# Patient Record
Sex: Male | Born: 1980 | Race: Asian | Hispanic: No | Marital: Married | Smoking: Never smoker
Health system: Southern US, Community
[De-identification: ages and names within clinical notes are randomized; demographics above are authoritative.]

## PROBLEM LIST (undated history)

## (undated) ENCOUNTER — Ambulatory Visit (HOSPITAL_COMMUNITY): Discharge status: Absent without leave | Payer: Self-pay

## (undated) DIAGNOSIS — E785 Hyperlipidemia, unspecified: Secondary | ICD-10-CM

## (undated) DIAGNOSIS — K219 Gastro-esophageal reflux disease without esophagitis: Secondary | ICD-10-CM

## (undated) HISTORY — DX: Gastro-esophageal reflux disease without esophagitis: K21.9

## (undated) HISTORY — DX: Hyperlipidemia, unspecified: E78.5

---

## 2008-11-21 ENCOUNTER — Emergency Department (HOSPITAL_COMMUNITY): Admission: EM | Admit: 2008-11-21 | Discharge: 2008-11-21 | Payer: Self-pay | Admitting: Family Medicine

## 2012-12-04 ENCOUNTER — Ambulatory Visit: Payer: BC Managed Care – PPO

## 2012-12-04 ENCOUNTER — Ambulatory Visit (INDEPENDENT_AMBULATORY_CARE_PROVIDER_SITE_OTHER): Payer: BC Managed Care – PPO | Admitting: Emergency Medicine

## 2012-12-04 VITALS — BP 122/75 | HR 81 | Temp 98.1°F | Resp 16 | Ht 71.0 in | Wt 200.0 lb

## 2012-12-04 DIAGNOSIS — R1013 Epigastric pain: Secondary | ICD-10-CM

## 2012-12-04 MED ORDER — ESOMEPRAZOLE MAGNESIUM 40 MG PO PACK: 40.0000 mg | PACK | Freq: Every day | ORAL | Status: DC

## 2012-12-04 NOTE — Progress Notes (Signed)
  Subjective:    Patient ID: Kenneth Sandoval, male    DOB: Feb 09, 1981, 32 y.o.   MRN: [id number]  HPI This 32 y.o. male presents for evaluation of abdominal pain x 2-3 weeks. 5/10. Comes and goes without trigger.  Last couple of days he's had difficulty burping. "Cramping" type pain in the upper abdomen (points to the epigastrum). Had a cold last week, with a fever and cough and runny nose, all now resolved.  Not exacerbated by eating. No NSAIDS, EtOH, increase in spicy/fatty/acidic foods, stress.  No tobacco use.  No stool changes. Possibly some increased bowel gas. No nausea/vomiting.  Past medical history, surgical history, family history, social history and problem list reviewed.  Review of Systems As above.    Objective:   Physical Exam Blood pressure 122/75, pulse 81, temperature 98.1 F (36.7 C), resp. rate 16, height 5\' 11"  (1.803 m), weight 200 lb (90.719 kg). Body mass index is 27.91 kg/(m^2). Well-developed, well nourished male who is awake, alert and oriented, in NAD. HEENT: [place]/AT, sclera and conjunctiva are clear.   Neck: supple, non-tender, no lymphadenopathy, thyromegaly. Heart: RRR, no murmur Lungs: normal effort, CTA Abdomen: normo-active bowel sounds, supple, no mass or organomegaly. Mild epigastric tenderness. Extremities: no cyanosis, clubbing or edema. Skin: warm and dry without rash. Psychologic: good mood and appropriate affect, normal speech and behavior.    Acute Abdominal Series: [facility] reading (PRIMARY) by  Dr. Dareen Piano.  No free air.  No evidence of ileus/obstruction/mass. Large stool in the colon.  Otherwise normal abdomen and chest.      Assessment & Plan:  Abdominal pain, epigastric - Plan: DG Abd Acute W/Chest, esomeprazole (NEXIUM) 40 MG packet, Comprehensive metabolic panel, H. pylori antibody, IgG  Anticipatory guidance.  Avoid reflux triggers.  Fernande Bras, PA-C Physician Assistant-Certified [facility]

## 2012-12-04 NOTE — Patient Instructions (Addendum)
Things that often make reflux symptoms worse: Caffeine Carbonation (soda) Spicy foods Acidic foods (like tomato sauce, orange juice, lemonade) Fatty foods (including whole milk and ice cream) Stress (feeling sad, worried, nervous) Nicotine Alcohol Anti-inflammatory medications (Advil, Aleve, ibuprofen, naproxen)  I will contact you with your lab results as soon as they are available.   If you have not heard from me in 2 weeks, please contact me.  The fastest way to get your results is to register for My Chart (see the instructions on the last page of this printout).

## 2012-12-05 LAB — COMPREHENSIVE METABOLIC PANEL
ALT: 89 U/L — ABNORMAL HIGH (ref 0–53)
AST: 32 U/L (ref 0–37)
Alkaline Phosphatase: 87 U/L (ref 39–117)
CO2: 27 mEq/L (ref 19–32)
Sodium: 142 mEq/L (ref 135–145)
Total Bilirubin: 0.5 mg/dL (ref 0.3–1.2)
Total Protein: 7.5 g/dL (ref 6.0–8.3)

## 2012-12-08 MED ORDER — AMOXICILLIN 500 MG PO CAPS: 1000.0000 mg | ORAL_CAPSULE | Freq: Two times a day (BID) | ORAL | Status: AC

## 2012-12-08 MED ORDER — CLARITHROMYCIN 500 MG PO TABS: 500.0000 mg | ORAL_TABLET | Freq: Two times a day (BID) | ORAL | Status: AC

## 2012-12-08 NOTE — Addendum Note (Signed)
Addended by: Fernande Bras on: 12/08/2012 03:10 PM   Modules accepted: Orders

## 2015-04-06 ENCOUNTER — Emergency Department (INDEPENDENT_AMBULATORY_CARE_PROVIDER_SITE_OTHER)
Admission: EM | Admit: 2015-04-06 | Discharge: 2015-04-06 | Disposition: A | Discharge status: Home / Self Care | Payer: Self-pay | Source: Home / Self Care

## 2015-04-06 ENCOUNTER — Encounter (HOSPITAL_COMMUNITY): Payer: Self-pay | Admitting: *Deleted

## 2015-04-06 DIAGNOSIS — R200 Anesthesia of skin: Secondary | ICD-10-CM

## 2015-04-06 DIAGNOSIS — R202 Paresthesia of skin: Secondary | ICD-10-CM

## 2015-04-06 DIAGNOSIS — D1720 Benign lipomatous neoplasm of skin and subcutaneous tissue of unspecified limb: Secondary | ICD-10-CM

## 2015-04-06 DIAGNOSIS — D1739 Benign lipomatous neoplasm of skin and subcutaneous tissue of other sites: Secondary | ICD-10-CM

## 2015-04-06 DIAGNOSIS — R5383 Other fatigue: Secondary | ICD-10-CM

## 2015-04-06 LAB — POCT I-STAT, CHEM 8
BUN: 12 mg/dL (ref 6–20)
CREATININE: 0.9 mg/dL (ref 0.61–1.24)
Calcium, Ion: 1.19 mmol/L (ref 1.12–1.23)
Chloride: 102 mmol/L (ref 101–111)
GLUCOSE: 100 mg/dL — AB (ref 65–99)
HEMATOCRIT: 45 % (ref 39.0–52.0)
HEMOGLOBIN: 15.3 g/dL (ref 13.0–17.0)
POTASSIUM: 3.7 mmol/L (ref 3.5–5.1)
Sodium: 140 mmol/L (ref 135–145)
TCO2: 24 mmol/L (ref 0–100)

## 2015-04-06 NOTE — ED Notes (Addendum)
For  The  Last  3  -4  Weeks   Random pains  Weakness   Numbness   Hands  And  Feet    Fingers  Seem   sensative       No  Headaches            Anxiety         Under  Stress           stress  At  School      Also  Hands  Lump  And  Pain  r  Thigh

## 2017-05-10 ENCOUNTER — Other Ambulatory Visit: Payer: Self-pay | Admitting: Family Medicine

## 2017-05-10 ENCOUNTER — Ambulatory Visit
Admission: RE | Admit: 2017-05-10 | Discharge: 2017-05-10 | Disposition: A | Discharge status: Home / Self Care | Payer: Managed Care, Other (non HMO) | Source: Ambulatory Visit | Attending: Family Medicine | Admitting: Family Medicine

## 2017-05-10 DIAGNOSIS — R519 Headache, unspecified: Secondary | ICD-10-CM

## 2017-05-10 DIAGNOSIS — R51 Headache: Principal | ICD-10-CM

## 2017-05-10 DIAGNOSIS — G8929 Other chronic pain: Secondary | ICD-10-CM

## 2017-07-09 ENCOUNTER — Other Ambulatory Visit: Payer: Self-pay | Admitting: *Deleted

## 2017-07-09 ENCOUNTER — Encounter: Payer: Self-pay | Admitting: *Deleted

## 2017-07-09 ENCOUNTER — Ambulatory Visit: Payer: Managed Care, Other (non HMO) | Admitting: Diagnostic Neuroimaging

## 2017-07-09 ENCOUNTER — Encounter (INDEPENDENT_AMBULATORY_CARE_PROVIDER_SITE_OTHER): Payer: Self-pay

## 2017-07-09 VITALS — BP 148/88 | HR 105 | Ht 69.5 in | Wt 193.0 lb

## 2017-07-09 DIAGNOSIS — G43809 Other migraine, not intractable, without status migrainosus: Secondary | ICD-10-CM | POA: Diagnosis not present

## 2017-07-09 DIAGNOSIS — R519 Headache, unspecified: Secondary | ICD-10-CM

## 2017-07-09 DIAGNOSIS — R51 Headache: Secondary | ICD-10-CM | POA: Diagnosis not present

## 2017-07-09 DIAGNOSIS — G4709 Other insomnia: Secondary | ICD-10-CM | POA: Diagnosis not present

## 2017-07-09 DIAGNOSIS — G4489 Other headache syndrome: Secondary | ICD-10-CM | POA: Diagnosis not present

## 2017-07-09 MED ORDER — PREDNISONE 10 MG PO TABS: ORAL_TABLET | ORAL | 0 refills | Status: DC

## 2017-07-09 NOTE — Patient Instructions (Addendum)
-   check MRI brain  - check sleep study  - trial of prednisone dose pack; take 60mg  on day 1. Reduce by 10mg  each subsequent day. (60, 50, 40, 30, 20, 10, stop)

## 2017-07-09 NOTE — Progress Notes (Signed)
[facility]  PATIENT: Kenneth Sandoval DOB: [birth year]  REFERRING CLINICIAN: D Swayne HISTORY FROM: patient  REASON FOR VISIT: new consult    HISTORICAL  CHIEF COMPLAINT:  Chief Complaint  Patient presents with  . NP Dr. Antony Contras  . Numbness/ Headaches    having intermittent numbness in fingers and toes, but the main reason here if for headaches.     HISTORY OF PRESENT ILLNESS:   37 year old male with hyperglycemia, here for evaluation of headaches and numbness and tingling.  October 2018 patient had onset of intermittent right-sided headaches with heavy sensation.  Sometimes he would feel frontal or bilateral occipital headaches.  Headaches can last minutes or hours at a time.  Headaches occurring on daily basis.  No nausea or vomiting.  No photophobia.  He has some sensitivity to sound.  No dizziness or blurred vision with headaches.  No prior similar headaches.  No significant triggering factors.  No change in caffeine, nutrition, exercise, sleep or stress.  He was under increased stress 9 months ago and 2 years ago but not currently.  Patient is physically active, exercises 2-3 times per week including jogging and playing soccer.  He does not feel limited with physical activity by these headaches.  Headaches do not worsen with exertion or exercise.  Lately patient wakes up with sinus congestion on the right side of his nose and face.  Sometimes when he clears his nose he sees some blood.  He is planning to have ENT appointment soon.  Patient does note some generalized blurred vision lately, and spends a lot of time on the computer.  He is planning to have an eye exam soon.  No family history of headaches.  No recent traumas, injuries or infections.  Patient has tried Tylenol and Excedrin Migraine without relief.  Also 3 weeks ago patient had new onset of numbness and tingling in his fingers and toes, lasting for 1-2 weeks.  Now symptoms have  resolved.    REVIEW OF SYSTEMS: Full 14 system review of systems performed and negative with exception of: Headache numbness.  ALLERGIES: No Known Allergies  HOME MEDICATIONS: Outpatient Medications Prior to Visit  Medication Sig Dispense Refill  . esomeprazole (NEXIUM) 40 MG packet Take 40 mg by mouth daily before breakfast. 30 each 3  . aspirin 325 MG tablet Take 325 mg by mouth daily.     No facility-administered medications prior to visit.     PAST MEDICAL HISTORY: Past Medical History:  Diagnosis Date  . GERD (gastroesophageal reflux disease)     PAST SURGICAL HISTORY: No past surgical history on file.  FAMILY HISTORY: Family History  Problem Relation Age of Onset  . Asthma Father     SOCIAL HISTORY:  Social History   Socioeconomic History  . Marital status: Married    Spouse name: Not on file  . Number of children: 2  . Years of education: Not on file  . Highest education level: Not on file  Social Needs  . Financial resource strain: Not on file  . Food insecurity - worry: Not on file  . Food insecurity - inability: Not on file  . Transportation needs - medical: Not on file  . Transportation needs - non-medical: Not on file  Occupational History  . 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 Smoking status: Never Smoker  . Smokeless tobacco: Never Used  Substance and Sexual Activity  . Alcohol  use: No  . Drug use: No  . Sexual activity: Not on file  Other Topics Concern  . Not on file  Social History Narrative   Originally from [place], came to the [place] in 2009.  Lives at home with wife and 2 kids.  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       PHYSICAL EXAM  GENERAL EXAM/CONSTITUTIONAL: Vitals:  Vitals:   07/09/17 1322  BP: (!) 148/88  Pulse: (!) 105  Weight: 193 lb (87.5 kg)  Height: 5' 9.5" (1.765 m)     Body mass index is 28.09  kg/m.  No exam data present  Patient is in no distress; well developed, nourished and groomed; neck is supple  CARDIOVASCULAR:  Examination of carotid arteries is normal; no carotid bruits  Regular rate and rhythm, no murmurs  Examination of peripheral vascular system by observation and palpation is normal  EYES:  Ophthalmoscopic exam of optic discs and posterior segments is normal; no papilledema or hemorrhages  MUSCULOSKELETAL:  Gait, strength, tone, movements noted in Neurologic exam below  NEUROLOGIC: MENTAL STATUS:  No flowsheet data found.  awake, alert, oriented to person, place and time  recent and remote memory intact  normal attention and concentration  language fluent, comprehension intact, naming intact,   fund of knowledge appropriate  CRANIAL NERVE:   2nd - no papilledema on fundoscopic exam  2nd, 3rd, 4th, 6th - pupils equal and reactive to light, visual fields full to confrontation, extraocular muscles intact, no nystagmus  5th - facial sensation symmetric  7th - facial strength symmetric  8th - hearing intact  9th - palate elevates symmetrically, uvula midline  11th - shoulder shrug symmetric  12th - tongue protrusion midline  MOTOR:   normal bulk and tone, full strength in the BUE, BLE  SENSORY:   normal and symmetric to light touch, temperature, vibration  COORDINATION:   finger-nose-finger, fine finger movements normal  REFLEXES:   deep tendon reflexes present and symmetric  GAIT/STATION:   narrow based gait    DIAGNOSTIC DATA (LABS, IMAGING, TESTING) - I reviewed patient records, labs, notes, testing and imaging myself where available.  Lab Results  Component Value Date   HGB 15.3 04/06/2015   HCT 45.0 04/06/2015      Component Value Date/Time   NA 140 04/06/2015 1904   K 3.7 04/06/2015 1904   CL 102 04/06/2015 1904   CO2 27 12/04/2012 2032   GLUCOSE 100 (H) 04/06/2015 1904   BUN 12 04/06/2015 1904    CREATININE 0.90 04/06/2015 1904   CREATININE 1.07 12/04/2012 2032   CALCIUM 10.0 12/04/2012 2032   PROT 7.5 12/04/2012 2032   ALBUMIN 4.7 12/04/2012 2032   AST 32 12/04/2012 2032   ALT 89 (H) 12/04/2012 2032   ALKPHOS 87 12/04/2012 2032   BILITOT 0.5 12/04/2012 2032   No results found for: CHOL, HDL, LDLCALC, LDLDIRECT, TRIG, CHOLHDL No results found for: HGBA1C No results found for: VITAMINB12 No results found for: TSH   05/10/17 CT head [I reviewed images myself and agree with interpretation. -VRP]  - negative      ASSESSMENT AND PLAN  37 y.o. year old male here with new onset right-sided headaches, global headaches, with phonophobia, numbness and tingling, since October 2018.  Could represent migraine variant.  Will rule out other causes with MRI of the brain and sleep study.   Dx:   1. Chronic daily headache   2. Migraine variant  3. Other insomnia      PLAN:  - check MRI brain w wo - check sleep study (interrupted sleep; waking up with HA; sinus congestion in AM; elevated BP; rule out OSA) - trial of prednisone dose pack  Orders Placed This Encounter  Procedures  . MR BRAIN W WO CONTRAST  . Ambulatory referral to Sleep Studies   Meds ordered this encounter  Medications  . predniSONE (DELTASONE) 10 MG tablet    Sig: Take 60mg  on day 1. Reduce by 10mg  each subsequent day. (60, 50, 40, 30, 20, 10, stop)    Dispense:  21 tablet    Refill:  0   Return in about 2 months (around 09/06/2017).    Penni Bombard, MD 0/60/1561, 5:37 PM Certified in Neurology, Neurophysiology and Neuroimaging  [facility] [address], [place], [place] [address] [phone]

## 2017-07-15 ENCOUNTER — Ambulatory Visit: Payer: Managed Care, Other (non HMO) | Admitting: Neurology

## 2017-07-15 ENCOUNTER — Encounter: Payer: Self-pay | Admitting: Neurology

## 2017-07-15 VITALS — BP 124/83 | HR 75 | Ht 72.0 in | Wt 195.0 lb

## 2017-07-15 DIAGNOSIS — R519 Headache, unspecified: Secondary | ICD-10-CM

## 2017-07-15 DIAGNOSIS — R51 Headache: Principal | ICD-10-CM

## 2017-07-15 DIAGNOSIS — E663 Overweight: Secondary | ICD-10-CM | POA: Diagnosis not present

## 2017-07-15 DIAGNOSIS — G478 Other sleep disorders: Secondary | ICD-10-CM

## 2017-07-15 DIAGNOSIS — R351 Nocturia: Secondary | ICD-10-CM

## 2017-07-15 NOTE — Patient Instructions (Addendum)
Thank you for choosing [facility] for your sleep related care! It was nice to meet you today! I appreciate that you entrust me with your sleep related healthcare concerns. I hope, I was able to address at least some of your concerns today, and that I can help you feel reassured and also get better.    Here is what we discussed today and what we came up with as our plan for you:    Based on your symptoms and your exam I believe you may be at small risk for obstructive sleep apnea or OSA, and I think we should proceed with a sleep study to determine whether you do or do not have OSA and how severe it is. If you have more than mild OSA, I want you to consider treatment with CPAP. Please remember, the risks and ramifications of moderate to severe obstructive sleep apnea or OSA are: Cardiovascular disease, including congestive heart failure, stroke, difficult to control hypertension, arrhythmias, and even type 2 diabetes has been linked to untreated OSA. Sleep apnea causes disruption of sleep and sleep deprivation in most cases, which, in turn, can cause recurrent headaches, problems with memory, mood, concentration, focus, and vigilance. Most people with untreated sleep apnea report excessive daytime sleepiness, which can affect their ability to drive. Please do not drive if you feel sleepy.   I will likely see you back after your sleep study to go over the test results and where to go from there. We will call you after your sleep study to advise about the results (most likely, you will hear from [place], my nurse) and to set up an appointment at the time, as necessary.    Our sleep lab administrative assistant, Arrie Aran will call you to schedule your sleep study. If you don't hear back from her by about 2 weeks from now, please feel free to call her at [phone]. You can leave a message with your phone number and concerns, if you get the voicemail box. She will call back as soon as possible.

## 2017-07-15 NOTE — Progress Notes (Signed)
Subjective:    Patient ID: Kenneth Sandoval is a 37 y.o. male.  HPI     Star Age, MD, PhD [facility] [address] [place], [place] [address] Dear Kenneth Sandoval,   I saw your patient, Kenneth Sandoval, upon your kind request in my clinic today for initial consultation of his sleep disorder, in particular, concern for underlying obstructive sleep apnea. The patient is unaccompanied today. As you know, Kenneth Sandoval is a 37 year old right-handed gentleman with an underlying medical history of reflux disease, recurrent headaches and overweight state, who reports waking up with headaches at times and sleep disruption. I reviewed your office note from 07/09/2017. His Epworth sleepiness score is 2 out of 24 today, fatigue score is 17 out of 63. He has some low back pain for years. No recent injury. He has multiple nighttime awakenings for no apparent reason. He is not sure if he snores. Wife has not complained about his sleep. He does not endorse any telltale symptoms of restless leg syndrome. He has nocturia about once per average night. He lives at home with his wife and 2 children. There are no pets in the bedroom and can sleep in her own bedroom. He is a nonsmoker and does not utilize alcohol and drinks caffeine in the form of coffee, typically 2 cups per day ENT, typically 4 cups per day on average. He works as an Producer, television/film/video. He does not have a family history of OSA. His bedtime is around midnight and wake up time around 6:20. He does not have any nocturnal headaches but has woken up noticing a headache.  His Past Medical History Is Significant For: Past Medical History:  Diagnosis Date  . GERD (gastroesophageal reflux disease)     His Past Surgical History Is Significant For: No past surgical history on file.  His Family History Is Significant For: Family History  Problem Relation Age of Onset  . Asthma Father     His Social  History Is Significant For: Social History   Socioeconomic History  . Marital status: Married    Spouse name: None  . Number of children: 2  . Years of education: None  . Highest education level: None  Social Needs  . Financial resource strain: None  . Food insecurity - worry: None  . Food insecurity - inability: None  . Transportation needs - medical: None  . Transportation needs - non-medical: None  Occupational History  . 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 Smoking status: Never Smoker  . Smokeless tobacco: Never Used  Substance and Sexual Activity  . Alcohol use: No  . Drug use: No  . Sexual activity: None  Other Topics Concern  . None  Social History Narrative   Originally from [place], came to the [place] in 2009.  Lives at home with wife and 2 kids.  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      His Allergies Are:  No Known Allergies:   His Current Medications Are:  Outpatient Encounter Medications as of 07/15/2017  Medication Sig  . predniSONE (DELTASONE) 10 MG tablet Take 60mg  on day 1. Reduce by 10mg  each subsequent day. (60, 50, 40, 30, 20, 10, stop)  . [DISCONTINUED] esomeprazole (NEXIUM) 40 MG packet Take 40 mg by mouth daily before breakfast.   No facility-administered encounter medications on file as of 07/15/2017.   :  Review of Systems:  Out of a complete 14 point review of systems, all are reviewed and negative with the exception of these symptoms as listed below: Review of Systems  Neurological:       Pt presents today to discuss his sleep. Pt has never had a sleep study and does not endorse snoring.  Epworth Sleepiness Scale 0= would never doze 1= slight chance of dozing 2= moderate chance of dozing 3= high chance of dozing  Sitting and reading: 0 Watching TV: 1 Sitting inactive in a public place (ex. Theater or meeting): 0 As a passenger in a  car for an hour without a break: 0 Lying down to rest in the afternoon: 1 Sitting and talking to someone: 0 Sitting quietly after lunch (no alcohol): 0 In a car, while stopped in traffic: 0 Total: 2     Objective:  Neurological Exam  Physical Exam Physical Examination:   Vitals:   07/15/17 0910  BP: 124/83  Pulse: 75    General Examination: The patient is a very pleasant 37 y.o. male in no acute distress. He appears well-developed and well-nourished and well groomed.   HEENT: Normocephalic, atraumatic, pupils are equal, round and reactive to light and accommodation. Extraocular tracking is good without limitation to gaze excursion or nystagmus noted. Normal smooth pursuit is noted. Hearing is grossly intact. Face is symmetric with normal facial animation and normal facial sensation. Speech is clear with no dysarthria noted. There is no hypophonia. There is no lip, neck/head, jaw or voice tremor. Neck is supple with full range of passive and active motion. There are no carotid bruits on auscultation. Oropharynx exam reveals: mild mouth dryness, good dental hygiene and mild airway crowding, due to larger appearing uvula, wider tongue. Mallampati is class II, tonsils are 1+ in size, neck circumference 15-7/8 inches. Tongue protrudes centrally and palate elevates symmetrically.  Chest: Clear to auscultation without wheezing, rhonchi or crackles noted.  Heart: S1+S2+0, regular and normal without murmurs, rubs or gallops noted.   Abdomen: Soft, non-tender and non-distended with normal bowel sounds appreciated on auscultation.  Extremities: There is no pitting edema in the distal lower extremities bilaterally. Pedal pulses are intact.  Skin: Warm and dry without trophic changes noted.  Musculoskeletal: exam reveals no obvious joint deformities, tenderness or joint swelling or erythema.   Neurologically:  Mental status: The patient is awake, alert and oriented in all 4 spheres. His  immediate and remote memory, attention, language skills and fund of knowledge are appropriate. There is no evidence of aphasia, agnosia, apraxia or anomia. Speech is clear with normal prosody and enunciation. Thought process is linear. Mood is normal and affect is normal.  Cranial nerves II - XII are as described above under HEENT exam. In addition: shoulder shrug is normal with equal shoulder height noted. Motor exam: Normal bulk, strength and tone is noted. There is no drift, tremor or rebound. Romberg is negative. Reflexes are 1+ throughout. Fine motor skills and coordination: intact with normal finger taps, normal hand movements, normal rapid alternating patting, normal foot taps and normal foot agility.  Cerebellar testing: No dysmetria or intention tremor. There is no truncal or gait ataxia.  Sensory exam: intact to light touch.  Gait, station and balance: He stands easily. No veering to one side is noted. No leaning to one side is noted. Posture is age-appropriate and stance is narrow based. Gait shows normal stride length and normal pace. No problems turning are noted.  Assessment and Plan:   In summary, Kenneth Sandoval is a very pleasant 37 y.o.-year old male with an underlying medical history of reflux disease, recurrent headaches and overweight state, who presents for sleep consultation. He does not have a telltale history concerning for obstructive sleep apnea, nevertheless, he endorses sleep disruption and nonrestorative sleep, nocturia, morning headaches. We talked about the importance of good sleep hygiene and allowing enough sleep time of 7-8 hours. He is not getting more than 6 hours of sleep on a good night. We talked about limiting caffeine intake as well. We will proceed with sleep study testing and take it from there.  I answered all his questions today and the patient was in agreement.  Thank you very much for allowing me to participate in the care of this nice  patient. If I can be of any further assistance to you please do not hesitate to talk to me. Sincerely,   Star Age, MD, PhD

## 2017-07-27 ENCOUNTER — Inpatient Hospital Stay: Admission: RE | Admit: 2017-07-27 | Payer: Managed Care, Other (non HMO) | Source: Ambulatory Visit

## 2017-07-29 ENCOUNTER — Telehealth: Payer: Self-pay | Admitting: Neurology

## 2017-07-29 NOTE — Telephone Encounter (Signed)
We have attempted to call the patient two times to schedule sleep study. Patient has been unavailable at the phone numbers we have on file, and has not returned our calls. At this time, we will send a letter asking patient to please contact the sleep lab.  °

## 2017-08-02 ENCOUNTER — Other Ambulatory Visit: Payer: Managed Care, Other (non HMO)

## 2017-09-27 ENCOUNTER — Ambulatory Visit
Admission: RE | Admit: 2017-09-27 | Discharge: 2017-09-27 | Disposition: A | Discharge status: Home / Self Care | Payer: Managed Care, Other (non HMO) | Source: Ambulatory Visit | Attending: Diagnostic Neuroimaging | Admitting: Diagnostic Neuroimaging

## 2017-09-27 DIAGNOSIS — G4489 Other headache syndrome: Secondary | ICD-10-CM

## 2017-09-27 MED ORDER — GADOBENATE DIMEGLUMINE 529 MG/ML IV SOLN
19.0000 mL | Freq: Once | INTRAVENOUS | Status: AC | PRN
  Administered 2017-09-27: 19 mL via INTRAVENOUS

## 2017-10-01 ENCOUNTER — Telehealth: Payer: Self-pay | Admitting: *Deleted

## 2017-10-01 NOTE — Telephone Encounter (Signed)
Spoke to pt and relayed that MRI normal study, continue current plan.  He verbalized understanding and has appt tomorrow 10-02-17.

## 2017-10-01 NOTE — Telephone Encounter (Signed)
-----   Message from Penni Bombard, MD sent at 09/30/2017 12:59 PM EDT ----- Normal imaging results. Please call patient. Continue current plan. -VRP

## 2017-10-02 ENCOUNTER — Encounter: Payer: Self-pay | Admitting: Diagnostic Neuroimaging

## 2017-10-02 ENCOUNTER — Ambulatory Visit: Payer: Managed Care, Other (non HMO) | Admitting: Diagnostic Neuroimaging

## 2017-10-02 VITALS — BP 122/72 | HR 74 | Ht 72.0 in | Wt 197.0 lb

## 2017-10-02 DIAGNOSIS — G43809 Other migraine, not intractable, without status migrainosus: Secondary | ICD-10-CM | POA: Diagnosis not present

## 2017-10-02 DIAGNOSIS — G4709 Other insomnia: Secondary | ICD-10-CM | POA: Diagnosis not present

## 2017-10-02 MED ORDER — AMITRIPTYLINE HCL 25 MG PO TABS: 25.0000 mg | ORAL_TABLET | Freq: Every day | ORAL | 6 refills | Status: DC

## 2017-10-02 NOTE — Progress Notes (Signed)
[facility]  PATIENT: Kenneth Sandoval DOB: [birth year]  REFERRING CLINICIAN: D Swayne HISTORY FROM: patient  REASON FOR VISIT: follow up   HISTORICAL  CHIEF COMPLAINT:  Chief Complaint  Patient presents with  . Headache    rm 6, "no improvement in chronic headaches; never did sleep study, don't really think I have a sleep problem"  . Follow-up    2 month    HISTORY OF PRESENT ILLNESS:   UPDATE (10/02/17, VRP): Since last visit, doing about the same. Numbness is resolved. Still with stuffy head feeling, intermittent headaches, (5/10 severity). MRI brain was normal. Did not get sleep study.    PRIOR HPI (07/09/17): 37 year old male with hyperglycemia, here for evaluation of headaches and numbness and tingling.  October 2018 patient had onset of intermittent right-sided headaches with heavy sensation.  Sometimes he would feel frontal or bilateral occipital headaches.  Headaches can last minutes or hours at a time.  Headaches occurring on daily basis.  No nausea or vomiting.  No photophobia.  He has some sensitivity to sound.  No dizziness or blurred vision with headaches.  No prior similar headaches.  No significant triggering factors.  No change in caffeine, nutrition, exercise, sleep or stress.  He was under increased stress 9 months ago and 2 years ago but not currently.  Patient is physically active, exercises 2-3 times per week including jogging and playing soccer.  He does not feel limited with physical activity by these headaches.  Headaches do not worsen with exertion or exercise.  Lately patient wakes up with sinus congestion on the right side of his nose and face.  Sometimes when he clears his nose he sees some blood.  He is planning to have ENT appointment soon.  Patient does note some generalized blurred vision lately, and spends a lot of time on the computer.  He is planning to have an eye exam soon.  No family history of headaches.  No recent  traumas, injuries or infections.  Patient has tried Tylenol and Excedrin Migraine without relief.  Also 3 weeks ago patient had new onset of numbness and tingling in his fingers and toes, lasting for 1-2 weeks.  Now symptoms have resolved.    REVIEW OF SYSTEMS: Full 14 system review of systems performed and negative with exception of: Headache numbness.  ALLERGIES: No Known Allergies  HOME MEDICATIONS: Outpatient Medications Prior to Visit  Medication Sig Dispense Refill  . predniSONE (DELTASONE) 10 MG tablet Take 60mg  on day 1. Reduce by 10mg  each subsequent day. (60, 50, 40, 30, 20, 10, stop) 21 tablet 0   No facility-administered medications prior to visit.     PAST MEDICAL HISTORY: Past Medical History:  Diagnosis Date  . GERD (gastroesophageal reflux disease)     PAST SURGICAL HISTORY: No past surgical history on file.  FAMILY HISTORY: Family History  Problem Relation Age of Onset  . Asthma Father     SOCIAL HISTORY:  Social History   Socioeconomic History  . Marital status: Married    Spouse name: Not on file  . Number of children: 2  . Years of education: Not on file  . Highest education level: Not on file  Occupational History  . 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 Financial resource strain: Not on file  . Food insecurity:    Worry: Not on file    Inability: Not on file  . Transportation needs:  Medical: Not on file    Non-medical: Not on file  Tobacco Use  . Smoking status: Never Smoker  . Smokeless tobacco: Never Used  Substance and Sexual Activity  . Alcohol use: No  . Drug use: No  . Sexual activity: Not on file  Lifestyle  . Physical activity:    Days per week: Not on file    Minutes per session: Not on file  . Stress: Not on file  Relationships  . Social connections:    Talks on phone: Not on file    Gets together: Not on file    Attends religious service: Not on  file    Active member of club or organization: Not on file    Attends meetings of clubs or organizations: Not on file    Relationship status: Not on file  . Intimate partner violence:    Fear of current or ex partner: Not on file    Emotionally abused: Not on file    Physically abused: Not on file    Forced sexual activity: Not on file  Other Topics Concern  . Not on file  Social History Narrative   Originally from [place], came to the [place] in 2009.  Lives at home with wife and 2 kids.  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       PHYSICAL EXAM  GENERAL EXAM/CONSTITUTIONAL: Vitals:  Vitals:   10/02/17 1323  BP: 122/72  Pulse: 74  Weight: 197 lb (89.4 kg)  Height: 6' (1.829 m)   Body mass index is 26.72 kg/m. No exam data present  Patient is in no distress; well developed, nourished and groomed; neck is supple  CARDIOVASCULAR:  Examination of carotid arteries is normal; no carotid bruits  Regular rate and rhythm, no murmurs  Examination of peripheral vascular system by observation and palpation is normal  EYES:  Ophthalmoscopic exam of optic discs and posterior segments is normal; no papilledema or hemorrhages  MUSCULOSKELETAL:  Gait, strength, tone, movements noted in Neurologic exam below  NEUROLOGIC: MENTAL STATUS:  No flowsheet data found.  awake, alert, oriented to person, place and time  recent and remote memory intact  normal attention and concentration  language fluent, comprehension intact, naming intact,   fund of knowledge appropriate  CRANIAL NERVE:   2nd - no papilledema on fundoscopic exam  2nd, 3rd, 4th, 6th - pupils equal and reactive to light, visual fields full to confrontation, extraocular muscles intact, no nystagmus  5th - facial sensation symmetric  7th - facial strength symmetric  8th - hearing intact  9th - palate elevates symmetrically, uvula midline  11th - shoulder shrug  symmetric  12th - tongue protrusion midline  MOTOR:   normal bulk and tone, full strength in the BUE, BLE  SENSORY:   normal and symmetric to light touch, temperature, vibration  COORDINATION:   finger-nose-finger, fine finger movements normal  REFLEXES:   deep tendon reflexes present and symmetric  GAIT/STATION:   narrow based gait    DIAGNOSTIC DATA (LABS, IMAGING, TESTING) - I reviewed patient records, labs, notes, testing and imaging myself where available.  Lab Results  Component Value Date   HGB 15.3 04/06/2015   HCT 45.0 04/06/2015      Component Value Date/Time   NA 140 04/06/2015 1904   K 3.7 04/06/2015 1904   CL 102 04/06/2015 1904   CO2 27 12/04/2012 2032   GLUCOSE 100 (H) 04/06/2015 1904   BUN 12  04/06/2015 1904   CREATININE 0.90 04/06/2015 1904   CREATININE 1.07 12/04/2012 2032   CALCIUM 10.0 12/04/2012 2032   PROT 7.5 12/04/2012 2032   ALBUMIN 4.7 12/04/2012 2032   AST 32 12/04/2012 2032   ALT 89 (H) 12/04/2012 2032   ALKPHOS 87 12/04/2012 2032   BILITOT 0.5 12/04/2012 2032   No results found for: CHOL, HDL, LDLCALC, LDLDIRECT, TRIG, CHOLHDL No results found for: HGBA1C No results found for: VITAMINB12 No results found for: TSH   05/10/17 CT head [I reviewed images myself and agree with interpretation. -VRP]  - negative   09/27/17 MRI brain  - This is a normal MRI of the brain with and without contrast.     ASSESSMENT AND PLAN  37 y.o. year old male here with new onset right-sided headaches, global headaches, with phonophobia, numbness and tingling, since October 2018.  Could represent migraine variant.     Dx:   1. Migraine variant   2. Other insomnia      PLAN:  MIGRAINE VARIANT - trial of amitriptyline 25mg  at bedtime - brain healthy habits reviewed - monitor headaches; consider ibuprofen, tylenol, amitriptyline, topiramate in future - consider follow up with PCP, allergy and ENT for sinus congestion issues  Return  in about 6 months (around 04/03/2018).    Penni Bombard, MD 2/83/1517, 6:16 PM Certified in Neurology, Neurophysiology and Neuroimaging  [facility] [address] [place], [place] [address] [phone]

## 2017-10-02 NOTE — Patient Instructions (Signed)
-   trial of amitriptyline 25mg  at bedtime  - monitor headaches  - consider ibuprofen, tylenol as needed   - start amitriptyline 25mg  at bedtime  - consider follow up with PCP, allergy and ENT for sinus congestion issues

## 2018-04-14 ENCOUNTER — Encounter: Payer: Self-pay | Admitting: Diagnostic Neuroimaging

## 2018-04-14 ENCOUNTER — Ambulatory Visit: Payer: Managed Care, Other (non HMO) | Admitting: Diagnostic Neuroimaging

## 2018-04-14 VITALS — BP 126/78 | HR 74 | Ht 72.0 in | Wt 191.6 lb

## 2018-04-14 DIAGNOSIS — G43809 Other migraine, not intractable, without status migrainosus: Secondary | ICD-10-CM

## 2018-04-14 NOTE — Progress Notes (Signed)
[facility]  PATIENT: Kenneth Sandoval DOB: 1980-12-15  REFERRING CLINICIAN: D Swayne HISTORY FROM: patient  REASON FOR VISIT: follow up   HISTORICAL  CHIEF COMPLAINT:  Chief Complaint  Patient presents with  . Migraine    rm 7, "not taking any medications now, better in last 3 weeks, I don't think it's migraines; I've had an earache on right side- ENT said I might have TMJ"  . Follow-up    6 month    HISTORY OF PRESENT ILLNESS:   UPDATE (04/14/18, VRP): Since last visit, doing about the same. Symptoms are stable. Severity is mild. No alleviating or aggravating factors. Daily sxs. Foggy feeling in the right side. Some right sided pain. Some right ear pain. Numbness has resolved. Has [facility] (tried zonisamide, trigger point injections). 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   UPDATE (10/02/17, VRP): Since last visit, doing about the same. Numbness is resolved. Still with stuffy head feeling, intermittent headaches, (5/10 severity). MRI brain was normal. Did not get sleep study.    PRIOR HPI (07/09/17): 37 year old male with hyperglycemia, here for evaluation of headaches and numbness and tingling.  October 2018 patient had onset of intermittent right-sided headaches with heavy sensation.  Sometimes he would feel frontal or bilateral occipital headaches.  Headaches can last minutes or hours at a time.  Headaches occurring on daily basis.  No nausea or vomiting.  No photophobia.  He has some sensitivity to sound.  No dizziness or blurred vision with headaches.  No prior similar headaches.  No significant triggering factors.  No change in caffeine, nutrition, exercise, sleep or stress.  He was under increased stress 9 months ago and 2 years ago but not currently.  Patient is physically active, exercises 2-3 times per week including jogging and playing soccer.  He does not feel limited with physical activity by these headaches.   Headaches do not worsen with exertion or exercise.  Lately patient wakes up with sinus congestion on the right side of his nose and face.  Sometimes when he clears his nose he sees some blood.  He is planning to have ENT appointment soon.  Patient does note some generalized blurred vision lately, and spends a lot of time on the computer.  He is planning to have an eye exam soon.  No family history of headaches.  No recent traumas, injuries or infections.  Patient has tried Tylenol and Excedrin Migraine without relief.  Also 3 weeks ago patient had new onset of numbness and tingling in his fingers and toes, lasting for 1-2 weeks.  Now symptoms have resolved.    REVIEW OF SYSTEMS: Full 14 system review of systems performed and negative with exception of: Headache numbness.  ALLERGIES: No Known Allergies  HOME MEDICATIONS: Outpatient Medications Prior to Visit  Medication Sig Dispense Refill  . amitriptyline (ELAVIL) 25 MG tablet Take 1 tablet (25 mg total) by mouth at bedtime. (Patient not taking: Reported on 04/14/2018) 30 tablet 6   No facility-administered medications prior to visit.     PAST MEDICAL HISTORY: Past Medical History:  Diagnosis Date  . GERD (gastroesophageal reflux disease)     PAST SURGICAL HISTORY: No past surgical history on file.  FAMILY HISTORY: Family History  Problem Relation Age of Onset  . Asthma Father     SOCIAL HISTORY:  Social History   Socioeconomic History  . Marital status: Married    Spouse name: Not on file  . Number of  children: 2  . Years of education: Not on file  . Highest education level: Not on file  Occupational History  . 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 Financial resource strain: Not on file  . Food insecurity:    Worry: Not on file    Inability: Not on file  . Transportation needs:    Medical: Not on file    Non-medical: Not on file  Tobacco Use    . Smoking status: Never Smoker  . Smokeless tobacco: Never Used  Substance and Sexual Activity  . Alcohol use: No  . Drug use: No  . Sexual activity: Not on file  Lifestyle  . Physical activity:    Days per week: Not on file    Minutes per session: Not on file  . Stress: Not on file  Relationships  . Social connections:    Talks on phone: Not on file    Gets together: Not on file    Attends religious service: Not on file    Active member of club or organization: Not on file    Attends meetings of clubs or organizations: Not on file    Relationship status: Not on file  . Intimate partner violence:    Fear of current or ex partner: Not on file    Emotionally abused: Not on file    Physically abused: Not on file    Forced sexual activity: Not on file  Other Topics Concern  . Not on file  Social History Narrative   Originally from [place], came to the [place] in 2009.  Lives at home with wife and 2 kids.  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       PHYSICAL EXAM  GENERAL EXAM/CONSTITUTIONAL: Vitals:  Vitals:   04/14/18 1322  BP: 126/78  Pulse: 74  Weight: 191 lb 9.6 oz (86.9 kg)  Height: 6' (1.829 m)   Body mass index is 25.99 kg/m. No exam data present  Patient is in no distress; well developed, nourished and groomed; neck is supple  CARDIOVASCULAR:  Examination of carotid arteries is normal; no carotid bruits  Regular rate and rhythm, no murmurs  Examination of peripheral vascular system by observation and palpation is normal  EYES:  Ophthalmoscopic exam of optic discs and posterior segments is normal; no papilledema or hemorrhages  MUSCULOSKELETAL:  Gait, strength, tone, movements noted in Neurologic exam below  NEUROLOGIC: MENTAL STATUS:  No flowsheet data found.  awake, alert, oriented to person, place and time  recent and remote memory intact  normal attention and concentration  language fluent,  comprehension intact, naming intact,   fund of knowledge appropriate  CRANIAL NERVE:   2nd - no papilledema on fundoscopic exam  2nd, 3rd, 4th, 6th - pupils equal and reactive to light, visual fields full to confrontation, extraocular muscles intact, no nystagmus  5th - facial sensation symmetric  7th - facial strength symmetric  8th - hearing intact  9th - palate elevates symmetrically, uvula midline  11th - shoulder shrug symmetric  12th - tongue protrusion midline  MOTOR:   normal bulk and tone, full strength in the BUE, BLE  SENSORY:   normal and symmetric to light touch, temperature, vibration  COORDINATION:   finger-nose-finger, fine finger movements normal  REFLEXES:   deep tendon reflexes present and symmetric  GAIT/STATION:   narrow based gait    DIAGNOSTIC DATA (LABS,  IMAGING, TESTING) - I reviewed patient records, labs, notes, testing and imaging myself where available.  Lab Results  Component Value Date   HGB 15.3 04/06/2015   HCT 45.0 04/06/2015      Component Value Date/Time   NA 140 04/06/2015 1904   K 3.7 04/06/2015 1904   CL 102 04/06/2015 1904   CO2 27 12/04/2012 2032   GLUCOSE 100 (H) 04/06/2015 1904   BUN 12 04/06/2015 1904   CREATININE 0.90 04/06/2015 1904   CREATININE 1.07 12/04/2012 2032   CALCIUM 10.0 12/04/2012 2032   PROT 7.5 12/04/2012 2032   ALBUMIN 4.7 12/04/2012 2032   AST 32 12/04/2012 2032   ALT 89 (H) 12/04/2012 2032   ALKPHOS 87 12/04/2012 2032   BILITOT 0.5 12/04/2012 2032   No results found for: CHOL, HDL, LDLCALC, LDLDIRECT, TRIG, CHOLHDL No results found for: HGBA1C No results found for: VITAMINB12 No results found for: TSH   05/10/17 CT head [I reviewed images myself and agree with interpretation. -VRP]  - negative   09/27/17 MRI brain [I reviewed images myself and agree with interpretation. -VRP]  - normal.     ASSESSMENT AND PLAN  37 y.o. year old male here with intermittent right-sided  headaches, foggy sensation, right ear pain, global headaches, with phonophobia, numbness and tingling, since October 2018.  Could represent migraine variant or other trigeminal autonomic cephalgia.  Meds tried and failed: amitriptyline, zonisamide   Dx:   1. Migraine variant      PLAN:  MIGRAINE VARIANT - brain healthy habits reviewed - monitor headaches; consider ibuprofen, tylenol, topiramate in future  Return if symptoms worsen or fail to improve, for return to PCP.    Penni Bombard, MD 19/62/2297, 9:89 PM Certified in Neurology, Neurophysiology and Neuroimaging  [facility] [address] [place], [place] [address] [phone]

## 2019-01-14 ENCOUNTER — Other Ambulatory Visit: Payer: Self-pay | Admitting: Neurology

## 2019-01-14 DIAGNOSIS — R519 Headache, unspecified: Secondary | ICD-10-CM

## 2019-01-16 ENCOUNTER — Ambulatory Visit
Admission: RE | Admit: 2019-01-16 | Discharge: 2019-01-16 | Disposition: A | Discharge status: Home / Self Care | Payer: Managed Care, Other (non HMO) | Source: Ambulatory Visit | Attending: Neurology | Admitting: Neurology

## 2019-01-16 ENCOUNTER — Other Ambulatory Visit: Payer: Self-pay | Admitting: Neurology

## 2019-01-16 ENCOUNTER — Other Ambulatory Visit: Payer: Self-pay

## 2019-01-16 DIAGNOSIS — R519 Headache, unspecified: Secondary | ICD-10-CM

## 2019-01-16 MED ORDER — GADOBENATE DIMEGLUMINE 529 MG/ML IV SOLN
18.0000 mL | Freq: Once | INTRAVENOUS | Status: AC | PRN
  Administered 2019-01-16: 18 mL via INTRAVENOUS

## 2019-02-05 ENCOUNTER — Other Ambulatory Visit: Payer: Managed Care, Other (non HMO)

## 2020-08-03 ENCOUNTER — Other Ambulatory Visit (HOSPITAL_COMMUNITY): Payer: Self-pay | Admitting: Gastroenterology

## 2020-08-03 ENCOUNTER — Other Ambulatory Visit: Payer: Self-pay | Admitting: Gastroenterology

## 2020-08-03 DIAGNOSIS — R1033 Periumbilical pain: Secondary | ICD-10-CM

## 2020-08-04 ENCOUNTER — Other Ambulatory Visit (HOSPITAL_COMMUNITY): Payer: Self-pay | Admitting: Gastroenterology

## 2020-08-04 ENCOUNTER — Other Ambulatory Visit: Payer: Self-pay | Admitting: Gastroenterology

## 2020-08-04 ENCOUNTER — Other Ambulatory Visit: Payer: Self-pay

## 2020-08-04 ENCOUNTER — Ambulatory Visit (HOSPITAL_BASED_OUTPATIENT_CLINIC_OR_DEPARTMENT_OTHER)
Admission: RE | Admit: 2020-08-04 | Discharge: 2020-08-04 | Disposition: A | Discharge status: Home / Self Care | Payer: 59 | Source: Ambulatory Visit | Attending: Gastroenterology | Admitting: Gastroenterology

## 2020-08-04 ENCOUNTER — Encounter (HOSPITAL_BASED_OUTPATIENT_CLINIC_OR_DEPARTMENT_OTHER): Payer: Self-pay

## 2020-08-04 DIAGNOSIS — N289 Disorder of kidney and ureter, unspecified: Secondary | ICD-10-CM

## 2020-08-04 DIAGNOSIS — R1033 Periumbilical pain: Secondary | ICD-10-CM | POA: Insufficient documentation

## 2020-08-04 MED ORDER — IOHEXOL 300 MG/ML  SOLN
100.0000 mL | Freq: Once | INTRAMUSCULAR | Status: AC | PRN
  Administered 2020-08-04: 100 mL via INTRAVENOUS

## 2020-08-05 ENCOUNTER — Ambulatory Visit (HOSPITAL_COMMUNITY)
Admission: RE | Admit: 2020-08-05 | Discharge: 2020-08-05 | Disposition: A | Discharge status: Home / Self Care | Payer: 59 | Source: Ambulatory Visit | Attending: Gastroenterology | Admitting: Gastroenterology

## 2020-08-05 DIAGNOSIS — N289 Disorder of kidney and ureter, unspecified: Secondary | ICD-10-CM | POA: Insufficient documentation

## 2020-08-05 MED ORDER — GADOBUTROL 1 MMOL/ML IV SOLN
9.0000 mL | Freq: Once | INTRAVENOUS | Status: AC | PRN
  Administered 2020-08-05: 9 mL via INTRAVENOUS

## 2020-09-01 ENCOUNTER — Other Ambulatory Visit (HOSPITAL_COMMUNITY): Payer: Self-pay | Admitting: Urology

## 2020-09-01 ENCOUNTER — Other Ambulatory Visit: Payer: Self-pay | Admitting: Urology

## 2020-09-01 DIAGNOSIS — D49511 Neoplasm of unspecified behavior of right kidney: Secondary | ICD-10-CM

## 2020-09-07 ENCOUNTER — Encounter (HOSPITAL_COMMUNITY): Payer: Self-pay

## 2020-09-07 ENCOUNTER — Other Ambulatory Visit: Payer: Self-pay

## 2020-09-07 ENCOUNTER — Ambulatory Visit (HOSPITAL_COMMUNITY)
Admission: RE | Admit: 2020-09-07 | Discharge: 2020-09-07 | Disposition: A | Discharge status: Home / Self Care | Payer: 59 | Source: Ambulatory Visit | Attending: Urology | Admitting: Urology

## 2020-09-07 DIAGNOSIS — Z8719 Personal history of other diseases of the digestive system: Secondary | ICD-10-CM | POA: Insufficient documentation

## 2020-09-07 DIAGNOSIS — C641 Malignant neoplasm of right kidney, except renal pelvis: Secondary | ICD-10-CM | POA: Insufficient documentation

## 2020-09-07 DIAGNOSIS — G629 Polyneuropathy, unspecified: Secondary | ICD-10-CM | POA: Insufficient documentation

## 2020-09-07 DIAGNOSIS — D49511 Neoplasm of unspecified behavior of right kidney: Secondary | ICD-10-CM | POA: Insufficient documentation

## 2020-09-07 DIAGNOSIS — K648 Other hemorrhoids: Secondary | ICD-10-CM | POA: Insufficient documentation

## 2020-09-07 DIAGNOSIS — K580 Irritable bowel syndrome with diarrhea: Secondary | ICD-10-CM | POA: Insufficient documentation

## 2020-09-07 DIAGNOSIS — E785 Hyperlipidemia, unspecified: Secondary | ICD-10-CM | POA: Insufficient documentation

## 2020-09-07 LAB — POCT I-STAT CREATININE: Creatinine, Ser: 0.9 mg/dL (ref 0.61–1.24)

## 2020-09-07 MED ORDER — IOHEXOL 300 MG/ML  SOLN
100.0000 mL | Freq: Once | INTRAMUSCULAR | Status: AC | PRN
  Administered 2020-09-07: 100 mL via INTRAVENOUS

## 2020-09-07 NOTE — Progress Notes (Signed)
Office Visit Note  Patient: Kenneth Sandoval             Date of Birth: [birth year]           MRN: [id number]             PCP: Antony Contras, MD Referring: Antony Contras, MD Visit Date: 09/08/2020 Occupation: '@GUAROCC' @  Subjective:  Pain in multiple joints and muscles.   History of Present Illness: Kenneth Sandoval is a 40 y.o. male seen in consultation per request of Dr. Collene Mares.  According to the patient he developed COVID-19 infection in November 2021.  He did not have any complications.  He states in December 2021 he started having grumbling sensation in his lower abdominal region.  He also started a new job at the same time.  He was seen by Dr. Collene Mares who did endoscopy and colonoscopy.  He was diagnosed with IBS.  He also had a CT scan of his abdomen and MRI which showed right renal mass consistent with renal cell carcinoma.  He was evaluated by urologist and is planning to have surgery in the near future.  He also had a CT scan of the chest and the results were negative.  He states he has been having discomfort in the thoracic spine for the last 3 weeks.  He states the pain radiates to the both thoracic region.  He recalls that he was gardening only 1 day a month ago and developed some lower back pain which resolved by itself.  He has been also experiencing pain in his left knee joint for the last 3 weeks.  He denies any joint swelling.  He states he used to have left knee joint pain many years ago which resolved about 12 years ago and now recurred.  None of the other joints are painful.  He has been also experiencing paresthesias in his hands and his feet for the last 3 weeks.  There is no family history of autoimmune disease.  Patient states that he developed rheumatism as a child and he was in the hospital for 2 weeks.  Then his symptoms resolved.  He had left knee joint discomfort off and on since then until 12 years ago.  He has 3 siblings and 2 children all are in good  health.  Activities of Daily Living:   Patient reports morning stiffness for 0 minutes.   Patient Denies nocturnal pain.  Difficulty dressing/grooming: Denies Difficulty climbing stairs: Denies Difficulty getting out of chair: Denies Difficulty using hands for taps, buttons, cutlery, and/or writing: Reports  Review of Systems  Constitutional: Positive for fatigue. Negative for night sweats.  HENT: Positive for mouth dryness. Negative for mouth sores and nose dryness.   Eyes: Negative for pain, redness, itching and dryness.  Respiratory: Negative for shortness of breath and difficulty breathing.   Cardiovascular: Negative for chest pain, palpitations, hypertension, irregular heartbeat and swelling in legs/feet.  Gastrointestinal: Negative for blood in stool, constipation and diarrhea.  Endocrine: Negative for increased urination.  Genitourinary: Negative for difficulty urinating.  Musculoskeletal: Positive for arthralgias, joint pain, myalgias, muscle tenderness and myalgias. Negative for joint swelling, muscle weakness and morning stiffness.  Skin: Negative for color change, rash, hair loss, nodules/bumps, redness, skin tightness, ulcers and sensitivity to sunlight.  Allergic/Immunologic: Negative for susceptible to infections.  Neurological: Positive for numbness. Negative for dizziness, fainting, headaches, memory loss, night sweats and weakness.  Hematological: Negative for bruising/bleeding tendency and swollen glands.  Psychiatric/Behavioral: Negative for depressed mood, confusion and  sleep disturbance. The patient is nervous/anxious.     PMFS History:  Patient Active Problem List   Diagnosis Date Noted  . Irritable bowel syndrome with diarrhea 09/07/2020  . Malignant neoplasm of right kidney ([facility]) 09/07/2020  . Internal hemorrhoids 09/07/2020  . Dyslipidemia 09/07/2020  . Neuropathy 09/07/2020  . History of gastroesophageal reflux (GERD) 09/07/2020    Past Medical History:   Diagnosis Date  . GERD (gastroesophageal reflux disease)     Family History  Problem Relation Age of Onset  . Asthma Father    History reviewed. No pertinent surgical history. Social History   Social History Narrative   Originally from [place], came to the [place] in 2009.  Lives at home with wife and 2 kids.  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     Immunization History  Administered Date(s) Administered  . PFIZER(Purple Top)SARS-COV-2 Vaccination 09/13/2019, 10/04/2019     Objective: Vital Signs: BP 119/87 (BP Location: Right Arm, Patient Position: Sitting, Cuff Size: Normal)   Pulse 82   Resp 15   Ht '5\' 10"'  (1.778 m)   Wt 177 lb 3.2 oz (80.4 kg)   BMI 25.43 kg/m    Physical Exam Vitals and nursing note reviewed.  Constitutional:      Appearance: He is well-developed.  HENT:     Head: Normocephalic and atraumatic.  Eyes:     Conjunctiva/sclera: Conjunctivae normal.     Pupils: Pupils are equal, round, and reactive to light.  Cardiovascular:     Rate and Rhythm: Normal rate and regular rhythm.     Heart sounds: Normal heart sounds.  Pulmonary:     Effort: Pulmonary effort is normal.     Breath sounds: Normal breath sounds.  Abdominal:     General: Bowel sounds are normal.     Palpations: Abdomen is soft.  Musculoskeletal:     Cervical back: Normal range of motion and neck supple.  Skin:    General: Skin is warm and dry.     Capillary Refill: Capillary refill takes less than 2 seconds.  Neurological:     Mental Status: He is alert and oriented to person, place, and time.  Psychiatric:        Behavior: Behavior normal.      Musculoskeletal Exam: C-spine thoracic and lumbar spine were in good range of motion.  He had no point tenderness.  There was no tenderness over SI joints.  He had good mobility in his lumbar and thoracic spine.  Shoulder joints, elbow joints, wrist joints were in good range of motion.  He has some  hypermobility in his elbows and wrist joints.  There was no tenderness over MCPs PIPs or DIPs.  Hip joints and knee joints with good range of motion.  No warmth swelling effusion was noted over his left knee joint.  No tenderness over ankles, MTPs or PIPs was noted.  There was no evidence of Achilles tendinitis or plantar fasciitis.  CDAI Exam: CDAI Score: -- Patient Global: --; Provider Global: -- Swollen: --; Tender: -- Joint Exam 09/08/2020   No joint exam has been documented for this visit   There is currently no information documented on the homunculus. Go to the Rheumatology activity and complete the homunculus joint exam.  Investigation: No additional findings.  Imaging: CT CHEST W CONTRAST  Result Date: 09/08/2020 CLINICAL DATA:  Chest staging, renal cell carcinoma EXAM: CT CHEST WITH CONTRAST TECHNIQUE: Multidetector CT imaging of the chest  was performed during intravenous contrast administration. CONTRAST:  179m OMNIPAQUE IOHEXOL 300 MG/ML  SOLN COMPARISON:  MR abdomen, 08/05/2020 FINDINGS: Cardiovascular: No significant vascular findings. Normal heart size. No pericardial effusion. Mediastinum/Nodes: No enlarged mediastinal, hilar, or axillary lymph nodes. Thyroid gland, trachea, and esophagus demonstrate no significant findings. Lungs/Pleura: Lungs are clear. No pleural effusion or pneumothorax. Upper Abdomen: No acute abnormality. A known mass of the posterior midportion of the right kidney is partially imaged (series 2, image 180). Musculoskeletal: No chest wall mass or suspicious bone lesions identified. IMPRESSION: 1. No evidence of metastatic disease in the chest. 2. A known mass of the posterior midportion of the right kidney is partially imaged, better assessed by prior MRI. Electronically Signed   By: AEddie CandleM.D.   On: 09/08/2020 10:58   XR Thoracic Spine 2 View  Result Date: 09/08/2020  No significant disc space narrowing was noted.  No syndesmophytes were noted.   Mild thoracic scoliosis was noted. Impression: Mild thoracic scoliosis was noted.   XR KNEE 3 VIEW LEFT  Result Date: 09/08/2020 No medial or lateral compartment narrowing was noted.  Moderate patellofemoral narrowing was noted.  No chondrocalcinosis was noted. 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   Recent Labs: Lab Results  Component Value Date   HGB 15.3 04/06/2015   NA 140 04/06/2015   K 3.7 04/06/2015   CL 102 04/06/2015   CO2 27 12/04/2012   GLUCOSE 100 (H) 04/06/2015   BUN 12 04/06/2015   CREATININE 0.90 09/07/2020   BILITOT 0.5 12/04/2012   ALKPHOS 87 12/04/2012   AST 32 12/04/2012   ALT 89 (H) 12/04/2012   PROT 7.5 12/04/2012   ALBUMIN 4.7 12/04/2012   CALCIUM 10.0 12/04/2012   July 18, 2020 IgM normal, anti-tTG negative, CMP with GFR normal, CBC normal, TSH normal  Speciality Comments: No specialty comments available.  Procedures:  No procedures performed Allergies: Patient has no known allergies.   Assessment / Plan:     Visit Diagnoses: Pain in thoracic spine -he has been experiencing midthoracic pain which goes into the rib cage.  The pain is started about 3 weeks ago.  He is on computer for many hours.  There was no point tenderness on my examination.  He had good mobility in his thoracic spine.  Plan: XR Thoracic Spine 2 View, x-ray of thoracic spine showed mild scoliosis.  No significant disc space narrowing was noted.  No syndesmophytes were noted.  He gives history of morning stiffness.  I will obtain HLA-B27 antigen.  Have given him a handout on thoracic spine exercises.  He will benefit from physical therapy in the future.  Acute pain of left knee -patient states that he had left knee joint pain since childhood when he had rheumatism until 12 years ago.  He had no discomfort in his knee joint in the last 12 years.  He states the pain started about 3 weeks ago.  He had no warmth swelling or effusion on my  examination.  Plan: XR KNEE 3 VIEW LEFT, x-ray of the knee joints were consistent with chondromalacia patella.  X-ray findings were discussed with the patient.  Have given her a handout on lower extremity muscle strengthening exercises.  I discussed a referral to physical therapy in the future.  I will obtain additional labs today.  Rheumatoid factor, Cyclic citrul peptide antibody, IgG.  Have advised him to contact me in case he develops any increased swelling.  Hypermobility arthralgia-he has hypermobility  in some of his joints.  He states his son also has hypermobility.  I discussed with him that hypermobility can contribute to arthralgias and increased risk of osteoarthritis.  Paresthesias -he has been experiencing paresthesias  in bilateral hands and feet for the last 3 weeks.- Plan: Vitamin B12, Folate  Myalgia -he complains of achiness in his muscles as well.  No muscular weakness was noted.  Plan: CK  Other fatigue -he complains of increased fatigue.  TSH was normal which was done by Dr. Collene Mares recently.  Plan: Serum protein electrophoresis with reflex  Malignant neoplasm of right kidney ([facility])-he was given the diagnosis of renal cell carcinoma based on the MRI results.  He has seen urologist and will be having surgery in the near future.  Dyslipidemia-he is not on any medications.  Irritable bowel syndrome with diarrhea-recent diagnosis.  Patient had a colonoscopy and endoscopy.  History of gastroesophageal reflux (GERD)-he states that the reflux symptoms are not prominent at this point.  Internal hemorrhoids-asymptomatic currently.  Orders: Orders Placed This Encounter  Procedures  . XR KNEE 3 VIEW LEFT  . XR Thoracic Spine 2 View  . CK  . Rheumatoid factor  . Cyclic citrul peptide antibody, IgG  . HLA-B27 antigen  . Serum protein electrophoresis with reflex  . Vitamin B12  . Folate   No orders of the defined types were placed in this encounter.     Follow-Up  Instructions: Return for Polyarthralgia.   Bo Merino, MD  Note - This record has been created using Editor, commissioning.  Chart creation errors have been sought, but may not always  have been located. Such creation errors do not reflect on  the standard of medical care.

## 2020-09-08 ENCOUNTER — Ambulatory Visit (INDEPENDENT_AMBULATORY_CARE_PROVIDER_SITE_OTHER): Payer: 59 | Admitting: Rheumatology

## 2020-09-08 ENCOUNTER — Other Ambulatory Visit: Payer: Self-pay

## 2020-09-08 ENCOUNTER — Ambulatory Visit: Payer: Self-pay

## 2020-09-08 ENCOUNTER — Encounter: Payer: Self-pay | Admitting: Rheumatology

## 2020-09-08 VITALS — BP 119/87 | HR 82 | Resp 15 | Ht 70.0 in | Wt 177.2 lb

## 2020-09-08 DIAGNOSIS — K580 Irritable bowel syndrome with diarrhea: Secondary | ICD-10-CM

## 2020-09-08 DIAGNOSIS — R202 Paresthesia of skin: Secondary | ICD-10-CM

## 2020-09-08 DIAGNOSIS — R5383 Other fatigue: Secondary | ICD-10-CM

## 2020-09-08 DIAGNOSIS — M791 Myalgia, unspecified site: Secondary | ICD-10-CM

## 2020-09-08 DIAGNOSIS — E785 Hyperlipidemia, unspecified: Secondary | ICD-10-CM

## 2020-09-08 DIAGNOSIS — C641 Malignant neoplasm of right kidney, except renal pelvis: Secondary | ICD-10-CM

## 2020-09-08 DIAGNOSIS — M25562 Pain in left knee: Secondary | ICD-10-CM | POA: Diagnosis not present

## 2020-09-08 DIAGNOSIS — M546 Pain in thoracic spine: Secondary | ICD-10-CM

## 2020-09-08 DIAGNOSIS — M2550 Pain in unspecified joint: Secondary | ICD-10-CM | POA: Diagnosis not present

## 2020-09-08 DIAGNOSIS — F5101 Primary insomnia: Secondary | ICD-10-CM

## 2020-09-08 DIAGNOSIS — K648 Other hemorrhoids: Secondary | ICD-10-CM

## 2020-09-08 DIAGNOSIS — Z8719 Personal history of other diseases of the digestive system: Secondary | ICD-10-CM

## 2020-09-08 DIAGNOSIS — G629 Polyneuropathy, unspecified: Secondary | ICD-10-CM

## 2020-09-08 NOTE — Patient Instructions (Signed)
Journal for Nurse Practitioners, 15(4), 263-267. Retrieved March 31, 2018 from [email or web address]">  Knee Exercises Ask your health care provider which exercises are safe for you. Do exercises exactly as told by your health care provider and adjust them as directed. It is normal to feel mild stretching, pulling, tightness, or discomfort as you do these exercises. Stop right away if you feel sudden pain or your pain gets worse. Do not begin these exercises until told by your health care provider. Stretching and range-of-motion exercises These exercises warm up your muscles and joints and improve the movement and flexibility of your knee. These exercises also help to relieve pain and swelling. Knee extension, prone 1. Lie on your abdomen (prone position) on a bed. 2. Place your left / right knee just beyond the edge of the surface so your knee is not on the bed. You can put a towel under your left / right thigh just above your kneecap for comfort. 3. Relax your leg muscles and allow gravity to straighten your knee (extension). You should feel a stretch behind your left / right knee. 4. Hold this position for __________ seconds. 5. Scoot up so your knee is supported between repetitions. Repeat __________ times. Complete this exercise __________ times a day. Knee flexion, active 1. Lie on your back with both legs straight. If this causes back discomfort, bend your left / right knee so your foot is flat on the floor. 2. Slowly slide your left / right heel back toward your buttocks. Stop when you feel a gentle stretch in the front of your knee or thigh (flexion). 3. Hold this position for __________ seconds. 4. Slowly slide your left / right heel back to the starting position. Repeat __________ times. Complete this exercise __________ times a day.   Quadriceps stretch, prone 1. Lie on your abdomen on a firm surface, such as a bed or padded floor. 2. Bend your left / right knee and hold  your ankle. If you cannot reach your ankle or pant leg, loop a belt around your foot and grab the belt instead. 3. Gently pull your heel toward your buttocks. Your knee should not slide out to the side. You should feel a stretch in the front of your thigh and knee (quadriceps). 4. Hold this position for __________ seconds. Repeat __________ times. Complete this exercise __________ times a day.   Hamstring, supine 1. Lie on your back (supine position). 2. Loop a belt or towel over the ball of your left / right foot. The ball of your foot is on the walking surface, right under your toes. 3. Straighten your left / right knee and slowly pull on the belt to raise your leg until you feel a gentle stretch behind your knee (hamstring). ? Do not let your knee bend while you do this. ? Keep your other leg flat on the floor. 4. Hold this position for __________ seconds. Repeat __________ times. Complete this exercise __________ times a day. Strengthening exercises These exercises build strength and endurance in your knee. Endurance is the ability to use your muscles for a long time, even after they get tired. Quadriceps, isometric This exercise stretches the muscles in front of your thigh (quadriceps) without moving your knee joint (isometric). 1. Lie on your back with your left / right leg extended and your other knee bent. Put a rolled towel or small pillow under your knee if told by your health care provider. 2. Slowly tense the muscles in the front of your   left / right thigh. You should see your kneecap slide up toward your hip or see increased dimpling just above the knee. This motion will push the back of the knee toward the floor. 3. For __________ seconds, hold the muscle as tight as you can without increasing your pain. 4. Relax the muscles slowly and completely. Repeat __________ times. Complete this exercise __________ times a day.   Straight leg raises This exercise stretches the muscles in  front of your thigh (quadriceps) and the muscles that move your hips (hip flexors). 1. Lie on your back with your left / right leg extended and your other knee bent. 2. Tense the muscles in the front of your left / right thigh. You should see your kneecap slide up or see increased dimpling just above the knee. Your thigh may even shake a bit. 3. Keep these muscles tight as you raise your leg 4-6 inches (10-15 cm) off the floor. Do not let your knee bend. 4. Hold this position for __________ seconds. 5. Keep these muscles tense as you lower your leg. 6. Relax your muscles slowly and completely after each repetition. Repeat __________ times. Complete this exercise __________ times a day. Hamstring, isometric 1. Lie on your back on a firm surface. 2. Bend your left / right knee about __________ degrees. 3. Dig your left / right heel into the surface as if you are trying to pull it toward your buttocks. Tighten the muscles in the back of your thighs (hamstring) to "dig" as hard as you can without increasing any pain. 4. Hold this position for __________ seconds. 5. Release the tension gradually and allow your muscles to relax completely for __________ seconds after each repetition. Repeat __________ times. Complete this exercise __________ times a day. Hamstring curls If told by your health care provider, do this exercise while wearing ankle weights. Begin with __________ lb weights. Then increase the weight by 1 lb (0.5 kg) increments. Do not wear ankle weights that are more than __________ lb. 1. Lie on your abdomen with your legs straight. 2. Bend your left / right knee as far as you can without feeling pain. Keep your hips flat against the floor. 3. Hold this position for __________ seconds. 4. Slowly lower your leg to the starting position. Repeat __________ times. Complete this exercise __________ times a day.   Squats This exercise strengthens the muscles in front of your thigh and knee  (quadriceps). 1. Stand in front of a table, with your feet and knees pointing straight ahead. You may rest your hands on the table for balance but not for support. 2. Slowly bend your knees and lower your hips like you are going to sit in a chair. ? Keep your weight over your heels, not over your toes. ? Keep your lower legs upright so they are parallel with the table legs. ? Do not let your hips go lower than your knees. ? Do not bend lower than told by your health care provider. ? If your knee pain increases, do not bend as low. 3. Hold the squat position for __________ seconds. 4. Slowly push with your legs to return to standing. Do not use your hands to pull yourself to standing. Repeat __________ times. Complete this exercise __________ times a day. Wall slides This exercise strengthens the muscles in front of your thigh and knee (quadriceps). 1. Lean your back against a smooth wall or door, and walk your feet out 18-24 inches (46-61 cm) from it. 2.   Place your feet hip-width apart. 3. Slowly slide down the wall or door until your knees bend __________ degrees. Keep your knees over your heels, not over your toes. Keep your knees in line with your hips. 4. Hold this position for __________ seconds. Repeat __________ times. Complete this exercise __________ times a day.   Straight leg raises This exercise strengthens the muscles that rotate the leg at the hip and move it away from your body (hip abductors). 1. Lie on your side with your left / right leg in the top position. Lie so your head, shoulder, knee, and hip line up. You may bend your bottom knee to help you keep your balance. 2. Roll your hips slightly forward so your hips are stacked directly over each other and your left / right knee is facing forward. 3. Leading with your heel, lift your top leg 4-6 inches (10-15 cm). You should feel the muscles in your outer hip lifting. ? Do not let your foot drift forward. ? Do not let your  knee roll toward the ceiling. 4. Hold this position for __________ seconds. 5. Slowly return your leg to the starting position. 6. Let your muscles relax completely after each repetition. Repeat __________ times. Complete this exercise __________ times a day.   Straight leg raises This exercise stretches the muscles that move your hips away from the front of the pelvis (hip extensors). 1. Lie on your abdomen on a firm surface. You can put a pillow under your hips if that is more comfortable. 2. Tense the muscles in your buttocks and lift your left / right leg about 4-6 inches (10-15 cm). Keep your knee straight as you lift your leg. 3. Hold this position for __________ seconds. 4. Slowly lower your leg to the starting position. 5. Let your leg relax completely after each repetition. Repeat __________ times. Complete this exercise __________ times a day. This information is not intended to replace advice given to you by your health care provider. Make sure you discuss any questions you have with your health care provider. Document Revised: 04/01/2018 Document Reviewed: 04/01/2018 [employer] Patient Education  2021 [employer]. Back Exercises The following exercises strengthen the muscles that help to support the trunk and back. They also help to keep the lower back flexible. Doing these exercises can help to prevent back pain or lessen existing pain.  If you have back pain or discomfort, try doing these exercises 2-3 times each day or as told by your health care provider.  As your pain improves, do them once each day, but increase the number of times that you repeat the steps for each exercise (do more repetitions).  To prevent the recurrence of back pain, continue to do these exercises once each day or as told by your health care provider. Do exercises exactly as told by your health care provider and adjust them as directed. It is normal to feel mild stretching, pulling, tightness, or  discomfort as you do these exercises, but you should stop right away if you feel sudden pain or your pain gets worse. Exercises Single knee to chest Repeat these steps 3-5 times for each leg: 5. Lie on your back on a firm bed or the floor with your legs extended. 6. Bring one knee to your chest. Your other leg should stay extended and in contact with the floor. 7. Hold your knee in place by grabbing your knee or thigh with both hands and hold. 8. Pull on your knee until  you feel a gentle stretch in your lower back or buttocks. 9. Hold the stretch for 10-30 seconds. 10. Slowly release and straighten your leg. Pelvic tilt Repeat these steps 5-10 times: 5. Lie on your back on a firm bed or the floor with your legs extended. 6. Bend your knees so they are pointing toward the ceiling and your feet are flat on the floor. 7. Tighten your lower abdominal muscles to press your lower back against the floor. This motion will tilt your pelvis so your tailbone points up toward the ceiling instead of pointing to your feet or the floor. 8. With gentle tension and even breathing, hold this position for 5-10 seconds. Cat-cow Repeat these steps until your lower back becomes more flexible: 5. Get into a hands-and-knees position on a firm surface. Keep your hands under your shoulders, and keep your knees under your hips. You may place padding under your knees for comfort. 6. Let your head hang down toward your chest. Contract your abdominal muscles and point your tailbone toward the floor so your lower back becomes rounded like the back of a cat. 7. Hold this position for 5 seconds. 8. Slowly lift your head, let your abdominal muscles relax and point your tailbone up toward the ceiling so your back forms a sagging arch like the back of a cow. 9. Hold this position for 5 seconds.   Press-ups Repeat these steps 5-10 times: 5. Lie on your abdomen (face-down) on the floor. 6. Place your palms near your head, about  shoulder-width apart. 7. Keeping your back as relaxed as possible and keeping your hips on the floor, slowly straighten your arms to raise the top half of your body and lift your shoulders. Do not use your back muscles to raise your upper torso. You may adjust the placement of your hands to make yourself more comfortable. 8. Hold this position for 5 seconds while you keep your back relaxed. 9. Slowly return to lying flat on the floor.   Bridges Repeat these steps 10 times: 7. Lie on your back on a firm surface. 8. Bend your knees so they are pointing toward the ceiling and your feet are flat on the floor. Your arms should be flat at your sides, next to your body. 9. Tighten your buttocks muscles and lift your buttocks off the floor until your waist is at almost the same height as your knees. You should feel the muscles working in your buttocks and the back of your thighs. If you do not feel these muscles, slide your feet 1-2 inches farther away from your buttocks. 10. Hold this position for 3-5 seconds. 11. Slowly lower your hips to the starting position, and allow your buttocks muscles to relax completely. If this exercise is too easy, try doing it with your arms crossed over your chest.   Abdominal crunches Repeat these steps 5-10 times: 6. Lie on your back on a firm bed or the floor with your legs extended. 7. Bend your knees so they are pointing toward the ceiling and your feet are flat on the floor. 8. Cross your arms over your chest. 9. Tip your chin slightly toward your chest without bending your neck. 10. Tighten your abdominal muscles and slowly raise your trunk (torso) high enough to lift your shoulder blades a tiny bit off the floor. Avoid raising your torso higher than that because it can put too much stress on your low back and does not help to strengthen your abdominal muscles.  11. Slowly return to your starting position. Back lifts Repeat these steps 5-10 times: 5. Lie on your  abdomen (face-down) with your arms at your sides, and rest your forehead on the floor. 6. Tighten the muscles in your legs and your buttocks. 7. Slowly lift your chest off the floor while you keep your hips pressed to the floor. Keep the back of your head in line with the curve in your back. Your eyes should be looking at the floor. 8. Hold this position for 3-5 seconds. 9. Slowly return to your starting position. Contact a health care provider if:  Your back pain or discomfort gets much worse when you do an exercise.  Your worsening back pain or discomfort does not lessen within 2 hours after you exercise. If you have any of these problems, stop doing these exercises right away. Do not do them again unless your health care provider says that you can. Get help right away if:  You develop sudden, severe back pain. If this happens, stop doing the exercises right away. Do not do them again unless your health care provider says that you can. This information is not intended to replace advice given to you by your health care provider. Make sure you discuss any questions you have with your health care provider. Document Revised: 10/16/2018 Document Reviewed: 03/13/2018 [employer] Patient Education  [place].

## 2020-09-09 ENCOUNTER — Ambulatory Visit (HOSPITAL_COMMUNITY): Payer: 59

## 2020-09-09 ENCOUNTER — Encounter (HOSPITAL_COMMUNITY): Payer: Self-pay

## 2020-09-09 LAB — CYCLIC CITRUL PEPTIDE ANTIBODY, IGG: Cyclic Citrullin Peptide Ab: <16 UNITS

## 2020-09-12 LAB — PROTEIN ELECTROPHORESIS, SERUM, WITH REFLEX
Albumin ELP: 5.1 g/dL — ABNORMAL HIGH (ref 3.8–4.8)
Alpha 1: 0.3 g/dL (ref 0.2–0.3)
Alpha 2: 0.6 g/dL (ref 0.5–0.9)
Beta 2: 0.3 g/dL (ref 0.2–0.5)
Beta Globulin: 0.5 g/dL (ref 0.4–0.6)
Gamma Globulin: 0.9 g/dL (ref 0.8–1.7)
Total Protein: 7.8 g/dL (ref 6.1–8.1)

## 2020-09-12 LAB — RHEUMATOID FACTOR: Rhuematoid fact SerPl-aCnc: <14 IU/mL (ref <14)

## 2020-09-12 LAB — CK: Total CK: 95 U/L (ref 44–196)

## 2020-09-12 LAB — HLA-B27 ANTIGEN: HLA-B27 Antigen: POSITIVE — AB

## 2020-09-12 LAB — VITAMIN B12: Vitamin B-12: 375 pg/mL (ref 200–1100)

## 2020-09-12 LAB — FOLATE: Folate: 13 ng/mL

## 2020-10-03 HISTORY — PX: PARTIAL NEPHRECTOMY: SHX414

## 2020-10-10 NOTE — Progress Notes (Signed)
Office Visit Note  Patient: Kenneth Sandoval             Date of Birth: [birth year]           MRN: [id number]             PCP: Kenneth Contras, MD Referring: Kenneth Contras, MD Visit Date: 10/11/2020 Occupation: _0 @  Subjective:  Thoracic pain, and lower back pain.  History of Present Illness: Kenneth Sandoval is a 40 y.o. male with history of renal cell carcinoma.  He had renal cell carcinoma resection on October 03, 2020 and the pathology confirmed the diagnosis.  The margins are clear.  He states he has been having increased pain and discomfort in the thoracic and lower back region now.  He also gives history of morning stiffness in SI joints.  He states he has nocturnal pain.  He is in constant discomfort.  He has been reading about the lab work and talk to his family members.  He has a first cousin who was diagnosed with ankylosing spondylitis and had a good response to Cimzia.  There are no other family members with ankylosing spondylitis.  He continues to have abdominal discomfort.  He has IBS and gets diarrhea.  His colonoscopy was negative.  Activities of Daily Living:  Patient reports morning stiffness for 0 minutes.   Patient Denies nocturnal pain.  Difficulty dressing/grooming: Denies Difficulty climbing stairs: Denies Difficulty getting out of chair: Denies Difficulty using hands for taps, buttons, cutlery, and/or writing: Denies  Review of Systems  Constitutional: Positive for fatigue. Negative for night sweats.  HENT: Positive for mouth dryness. Negative for mouth sores and nose dryness.   Eyes: Negative for pain, redness, itching and dryness.  Respiratory: Negative for shortness of breath and difficulty breathing.   Cardiovascular: Negative for chest pain, palpitations, hypertension, irregular heartbeat and swelling in legs/feet.  Gastrointestinal: Positive for diarrhea. Negative for blood in stool and constipation.  Endocrine: Negative for increased urination.   Genitourinary: Negative for difficulty urinating.  Musculoskeletal: Positive for arthralgias, joint pain, myalgias and myalgias. Negative for joint swelling, muscle weakness, morning stiffness and muscle tenderness.  Skin: Negative for color change, rash, hair loss, nodules/bumps, redness, skin tightness, ulcers and sensitivity to sunlight.  Allergic/Immunologic: Negative for susceptible to infections.  Neurological: Positive for numbness and parasthesias. Negative for dizziness, fainting, headaches, memory loss, night sweats and weakness.  Hematological: Negative for bruising/bleeding tendency and swollen glands.  Psychiatric/Behavioral: Negative for depressed mood, confusion and sleep disturbance. The patient is not nervous/anxious.     PMFS History:  Patient Active Problem List   Diagnosis Date Noted  . Irritable bowel syndrome with diarrhea 09/07/2020  . Malignant neoplasm of right kidney ([place]) 09/07/2020  . Internal hemorrhoids 09/07/2020  . Dyslipidemia 09/07/2020  . Neuropathy 09/07/2020  . History of gastroesophageal reflux (GERD) 09/07/2020    Past Medical History:  Diagnosis Date  . GERD (gastroesophageal reflux disease)     Family History  Problem Relation Age of Onset  . Asthma Father    Past Surgical History:  Procedure Laterality Date  . PARTIAL NEPHRECTOMY Right 10/03/2020   Social History   Social History Narrative   Originally from [place], came to the [place] in 2009.  Lives at home with wife and 2 kids.  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     Immunization History  Administered Date(s) Administered  . PFIZER(Purple Top)SARS-COV-2 Vaccination 09/13/2019, 10/04/2019     Objective: Vital  Signs: BP 131/87 (BP Location: Left Arm, Patient Position: Sitting, Cuff Size: Normal)   Pulse 82   Ht 5' 10" (1.778 m)   Wt 173 lb 3.2 oz (78.6 kg)   BMI 24.85 kg/m    Physical Exam Vitals and nursing note reviewed.   Constitutional:      Appearance: He is well-developed.  HENT:     Head: Normocephalic and atraumatic.  Eyes:     Conjunctiva/sclera: Conjunctivae normal.     Pupils: Pupils are equal, round, and reactive to light.  Cardiovascular:     Rate and Rhythm: Normal rate and regular rhythm.     Heart sounds: Normal heart sounds.  Pulmonary:     Effort: Pulmonary effort is normal.     Breath sounds: Normal breath sounds.  Abdominal:     General: Bowel sounds are normal.     Palpations: Abdomen is soft.     Comments: Abdominal tenderness due to recent surgery.  Musculoskeletal:     Cervical back: Normal range of motion and neck supple.  Skin:    General: Skin is warm and dry.     Capillary Refill: Capillary refill takes less than 2 seconds.  Neurological:     Mental Status: He is alert and oriented to person, place, and time.  Psychiatric:        Behavior: Behavior normal.      Musculoskeletal Exam: C-spine was in good range of motion.  It was difficult to assess range of motion of thoracic and lumbar spine due to recent nephrectomy.  He had no tenderness over SI joints although he complains of morning stiffness.  Shoulder joints, elbow joints, wrist joints were in good range of motion with some hypermobility.  Hip joints, knee joints, ankles were in good range of motion.  There was no tenderness over ankles or MTPs.  There was no evidence of Achilles tendinitis or plantar fasciitis.  CDAI Exam: CDAI Score: -- Patient Global: --; Provider Global: -- Swollen: --; Tender: -- Joint Exam 10/11/2020   No joint exam has been documented for this visit   There is currently no information documented on the homunculus. Go to the Rheumatology activity and complete the homunculus joint exam.  Investigation: No additional findings.  Imaging: XR Lumbar Spine 2-3 Views  Result Date: 10/11/2020 Mild levoscoliosis was noted.  No disc space narrowing was noted.  No syndesmophytes were noted.   Mild facet joint arthropathy was noted at L5-S1 region. Impression: Mild levoscoliosis and mild facet joint arthropathy was noted.  XR Pelvis 1-2 Views  Result Date: 10/11/2020 Mild SI joint sclerosis was noted.  No joint space narrowing or erosive changes were noted. Impression: Mild SI joint sclerosis was noted.   Recent Labs: Lab Results  Component Value Date   HGB 15.3 04/06/2015   NA 140 04/06/2015   K 3.7 04/06/2015   CL 102 04/06/2015   CO2 27 12/04/2012   GLUCOSE 100 (H) 04/06/2015   BUN 12 04/06/2015   CREATININE 0.90 09/07/2020   BILITOT 0.5 12/04/2012   ALKPHOS 87 12/04/2012   AST 32 12/04/2012   ALT 89 (H) 12/04/2012   PROT 7.8 09/08/2020   ALBUMIN 4.7 12/04/2012   CALCIUM 10.0 12/04/2012   September 08, 2020 SPEP normal, CK 95, RF negative, anti-CCP negative, B12 normal, folate normal, HLA-B27 positive   Speciality Comments: No specialty comments available.  Procedures:  No procedures performed Allergies: Patient has no known allergies.   Assessment / Plan:  Visit Diagnoses: Pain in thoracic spine - History of thoracic pain since March 2022.  He does computer work.  X-rays were unremarkable.  Handout on exercises was given.  It was difficult to assess the mobility in the thoracic spine today due to recent surgery.  Although he had good range of motion the last visit.  HLA-B27 positive.  Chronic midline low back pain without sciatica -he had been complaining of recent lower back pain.  He did not complain of lower back pain at the last visit.  Mobility of lumbar spine was difficult to assess due to recent surgery.  Plan: XR Lumbar Spine 2-3 Views.  X-ray showed mild scoliosis and mild facet joint arthropathy.  No syndesmophytes were noted.  Chronic SI joint pain -he has been reading about HLA-B27 positivity.  He states he has been experiencing SI joint discomfort for some time.  Although he did not complain about SI joint pain at the last visit.  He states he has  morning stiffness and nocturnal pain.  He had no SI joint tenderness on palpation.  Plan: XR Pelvis 1-2 Views, x-ray showed mild sclerosis but no joint space narrowing or erosive changes were noted.  I will check sedimentation rate, C-reactive protein in 6 weeks since he had recent surgery.  I discussed obtaining MRI of the SI joints.  Although he is hesitant at this time.  I advised him to contact me in case he wants to get MRI of the SI joints to look for sacroiliitis.  HLA B27 positive  Hypermobility arthralgia-he complains of generalized arthralgias and myalgias which could be a component of myofascial pain syndrome.  He has hypermobility in multiple joints.  Paresthesias - History of paresthesias in bilateral hands and feet since March 2022.  B12 and folate normal.  Myalgia - Probable myofascial pain.  CK normal.  Other fatigue -he continues to have significant fatigue.  Plan: CBC with Differential/Platelet, COMPLETE METABOLIC PANEL WITH GFR  Malignant neoplasm of right kidney ([place]) -he had robotic renal cell carcinoma resection.  Pathology report is positive for renal cancer.  Dyslipidemia  Irritable bowel syndrome with diarrhea - Followed by Dr. Collene Mares.  History of gastroesophageal reflux (GERD)  Internal hemorrhoids  Family history of ankylosing spondylitis -patient recently found out that he has a first cousin on maternal side who was HLA-B27 positive and had arthralgias and myalgias similar to patient's symptoms.  He was diagnosed with ankylosing spondylitis and was a started on Cimzia.   Orders: Orders Placed This Encounter  Procedures  . XR Pelvis 1-2 Views  . XR Lumbar Spine 2-3 Views  . CBC with Differential/Platelet  . COMPLETE METABOLIC PANEL WITH GFR  . Sedimentation rate  . C-reactive protein   No orders of the defined types were placed in this encounter.   Follow-Up Instructions: Return in about 2 months (around 12/11/2020) for HLA B -27 +.   Bo Merino,  MD  Note - This record has been created using Editor, commissioning.  Chart creation errors have been sought, but may not always  have been located. Such creation errors do not reflect on  the standard of medical care.

## 2020-10-11 ENCOUNTER — Ambulatory Visit: Payer: Self-pay

## 2020-10-11 ENCOUNTER — Encounter: Payer: Self-pay | Admitting: Rheumatology

## 2020-10-11 ENCOUNTER — Ambulatory Visit (INDEPENDENT_AMBULATORY_CARE_PROVIDER_SITE_OTHER): Payer: 59 | Admitting: Rheumatology

## 2020-10-11 ENCOUNTER — Other Ambulatory Visit: Payer: Self-pay

## 2020-10-11 VITALS — BP 131/87 | HR 82 | Ht 70.0 in | Wt 173.2 lb

## 2020-10-11 DIAGNOSIS — Z8719 Personal history of other diseases of the digestive system: Secondary | ICD-10-CM

## 2020-10-11 DIAGNOSIS — M5388 Other specified dorsopathies, sacral and sacrococcygeal region: Secondary | ICD-10-CM | POA: Diagnosis not present

## 2020-10-11 DIAGNOSIS — R5383 Other fatigue: Secondary | ICD-10-CM

## 2020-10-11 DIAGNOSIS — M545 Low back pain, unspecified: Secondary | ICD-10-CM | POA: Diagnosis not present

## 2020-10-11 DIAGNOSIS — C641 Malignant neoplasm of right kidney, except renal pelvis: Secondary | ICD-10-CM

## 2020-10-11 DIAGNOSIS — E785 Hyperlipidemia, unspecified: Secondary | ICD-10-CM

## 2020-10-11 DIAGNOSIS — M546 Pain in thoracic spine: Secondary | ICD-10-CM | POA: Diagnosis not present

## 2020-10-11 DIAGNOSIS — Z1589 Genetic susceptibility to other disease: Secondary | ICD-10-CM | POA: Diagnosis not present

## 2020-10-11 DIAGNOSIS — M5459 Other low back pain: Secondary | ICD-10-CM | POA: Diagnosis not present

## 2020-10-11 DIAGNOSIS — K648 Other hemorrhoids: Secondary | ICD-10-CM

## 2020-10-11 DIAGNOSIS — M533 Sacrococcygeal disorders, not elsewhere classified: Secondary | ICD-10-CM

## 2020-10-11 DIAGNOSIS — M2550 Pain in unspecified joint: Secondary | ICD-10-CM

## 2020-10-11 DIAGNOSIS — M791 Myalgia, unspecified site: Secondary | ICD-10-CM

## 2020-10-11 DIAGNOSIS — G8929 Other chronic pain: Secondary | ICD-10-CM | POA: Diagnosis not present

## 2020-10-11 DIAGNOSIS — M25562 Pain in left knee: Secondary | ICD-10-CM

## 2020-10-11 DIAGNOSIS — Z8269 Family history of other diseases of the musculoskeletal system and connective tissue: Secondary | ICD-10-CM

## 2020-10-11 DIAGNOSIS — K580 Irritable bowel syndrome with diarrhea: Secondary | ICD-10-CM

## 2020-10-11 DIAGNOSIS — R202 Paresthesia of skin: Secondary | ICD-10-CM

## 2020-10-11 NOTE — Patient Instructions (Signed)
return in 6 weeks for lab work

## 2020-12-01 NOTE — Progress Notes (Deleted)
Office Visit Note  Patient: Kenneth Sandoval             Date of Birth: 1980-08-11           MRN: [id number]             PCP: Antony Contras, MD Referring: Antony Contras, MD Visit Date: 12/15/2020 Occupation: @GUAROCC @  Subjective:  No chief complaint on file.   History of Present Illness: Kenneth Sandoval is a 40 y.o. male ***   Activities of Daily Living:  Patient reports morning stiffness for *** {minute/hour:19697}.   Patient {ACTIONS;DENIES/REPORTS:21021675::"Denies"} nocturnal pain.  Difficulty dressing/grooming: {ACTIONS;DENIES/REPORTS:21021675::"Denies"} Difficulty climbing stairs: {ACTIONS;DENIES/REPORTS:21021675::"Denies"} Difficulty getting out of chair: {ACTIONS;DENIES/REPORTS:21021675::"Denies"} Difficulty using hands for taps, buttons, cutlery, and/or writing: {ACTIONS;DENIES/REPORTS:21021675::"Denies"}  No Rheumatology ROS completed.   PMFS History:  Patient Active Problem List   Diagnosis Date Noted  . Irritable bowel syndrome with diarrhea 09/07/2020  . Malignant neoplasm of right kidney ([place]) 09/07/2020  . Internal hemorrhoids 09/07/2020  . Dyslipidemia 09/07/2020  . Neuropathy 09/07/2020  . History of gastroesophageal reflux (GERD) 09/07/2020    Past Medical History:  Diagnosis Date  . GERD (gastroesophageal reflux disease)     Family History  Problem Relation Age of Onset  . Asthma Father    Past Surgical History:  Procedure Laterality Date  . PARTIAL NEPHRECTOMY Right 10/03/2020   Social History   Social History Narrative   Originally from [place], came to the [place] in 2009.  Lives at home with wife and 2 kids.  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     Immunization History  Administered Date(s) Administered  . PFIZER(Purple Top)SARS-COV-2 Vaccination 09/13/2019, 10/04/2019     Objective: Vital Signs: There were no vitals taken for this visit.   Physical Exam   Musculoskeletal Exam:  ***  CDAI Exam: CDAI Score: -- Patient Global: --; Provider Global: -- Swollen: --; Tender: -- Joint Exam 12/15/2020   No joint exam has been documented for this visit   There is currently no information documented on the homunculus. Go to the Rheumatology activity and complete the homunculus joint exam.  Investigation: No additional findings.  Imaging: No results found.  Recent Labs: Lab Results  Component Value Date   HGB 15.3 04/06/2015   NA 140 04/06/2015   K 3.7 04/06/2015   CL 102 04/06/2015   CO2 27 12/04/2012   GLUCOSE 100 (H) 04/06/2015   BUN 12 04/06/2015   CREATININE 0.90 09/07/2020   BILITOT 0.5 12/04/2012   ALKPHOS 87 12/04/2012   AST 32 12/04/2012   ALT 89 (H) 12/04/2012   PROT 7.8 09/08/2020   ALBUMIN 4.7 12/04/2012   CALCIUM 10.0 12/04/2012    Speciality Comments: No specialty comments available.  Procedures:  No procedures performed Allergies: Patient has no known allergies.   Assessment / Plan:     Visit Diagnoses: No diagnosis found.  Orders: No orders of the defined types were placed in this encounter.  No orders of the defined types were placed in this encounter.   Face-to-face time spent with patient was *** minutes. Greater than 50% of time was spent in counseling and coordination of care.  Follow-Up Instructions: No follow-ups on file.   Earnestine Mealing, CMA  Note - This record has been created using Editor, commissioning.  Chart creation errors have been sought, but may not always  have been located. Such creation errors do not reflect on  the standard of medical care.

## 2020-12-15 ENCOUNTER — Ambulatory Visit: Payer: 59 | Admitting: Rheumatology

## 2021-08-28 ENCOUNTER — Other Ambulatory Visit: Payer: Self-pay | Admitting: Family Medicine

## 2021-08-28 DIAGNOSIS — E782 Mixed hyperlipidemia: Secondary | ICD-10-CM

## 2021-09-21 ENCOUNTER — Ambulatory Visit
Admission: RE | Admit: 2021-09-21 | Discharge: 2021-09-21 | Disposition: A | Discharge status: Home / Self Care | Payer: No Typology Code available for payment source | Source: Ambulatory Visit | Attending: Family Medicine | Admitting: Family Medicine

## 2021-09-21 DIAGNOSIS — E782 Mixed hyperlipidemia: Secondary | ICD-10-CM

## 2021-09-21 NOTE — Progress Notes (Signed)
? ?Office Visit Note ? ?Patient: Kenneth Sandoval             ?Date of Birth: February 07, 1981           ?MRN: [id number]             ?PCP: Antony Contras, MD ?Referring: Antony Contras, MD ?Visit Date: 09/26/2021 ?Occupation: _0 @ ? ?Subjective:  ?Thoracic and lower back pain ? ?History of Present Illness: Kenneth Sandoval is a 41 y.o. male returns today after his last visit on September 08, 2020.  He underwent right retroperitoneal robotic assisted partial nephrectomy on October 03, 2020.  He has been followed at [facility].  He started having thoracic pain in February 2022.  He states the pain continued after the surgery but feels not as intense.  In the last 3 months the pain has become more intense.  He has been having discomfort in his thoracic and lumbar region.  He has been having coccygeal pain for the last 9 months.  He has been seeing a urologist for the coccygeal pain.  He denies any history of Planter fasciitis, Achilles tendinitis, uveitis or shortness of breath.  There is no personal history of psoriasis.  He has not tried physical therapy yet.  He denies any history of psoriasis.  He had diarrhea in the past but he states the diarrhea symptoms resolved.  He denies any reflux. ? ?Activities of Daily Living:  ?Patient reports morning stiffness for 0 minutes.   ?Patient Denies nocturnal pain.  ?Difficulty dressing/grooming: Denies ?Difficulty climbing stairs: Denies ?Difficulty getting out of chair: Denies ?Difficulty using hands for taps, buttons, cutlery, and/or writing: Denies ? ?Review of Systems  ?Constitutional:  Negative for fatigue.  ?HENT:  Negative for mouth sores, mouth dryness and nose dryness.   ?Eyes:  Negative for pain, itching and dryness.  ?Respiratory:  Negative for shortness of breath and difficulty breathing.   ?Cardiovascular:  Negative for chest pain and palpitations.  ?Gastrointestinal:  Negative for blood in stool, constipation and diarrhea.  ?Endocrine: Negative for  increased urination.  ?Genitourinary:  Negative for difficulty urinating.  ?Musculoskeletal:  Positive for joint pain, joint pain, myalgias and myalgias. Negative for joint swelling, morning stiffness and muscle tenderness.  ?Skin:  Negative for color change, rash and redness.  ?Allergic/Immunologic: Negative for susceptible to infections.  ?Neurological:  Negative for dizziness, numbness, headaches, memory loss and weakness.  ?Hematological:  Negative for bruising/bleeding tendency.  ?Psychiatric/Behavioral:  Negative for confusion.   ? ?PMFS History:  ?Patient Active Problem List  ? Diagnosis Date Noted  ? Irritable bowel syndrome with diarrhea 09/07/2020  ? Malignant neoplasm of right kidney ([place]) 09/07/2020  ? Internal hemorrhoids 09/07/2020  ? Dyslipidemia 09/07/2020  ? Neuropathy 09/07/2020  ? History of gastroesophageal reflux (GERD) 09/07/2020  ?  ?Past Medical History:  ?Diagnosis Date  ? GERD (gastroesophageal reflux disease)   ?  ?Family History  ?Problem Relation Age of Onset  ? Asthma Father   ? ?Past Surgical History:  ?Procedure Laterality Date  ? PARTIAL NEPHRECTOMY Right 10/03/2020  ? ?Social History  ? ?Social History Narrative  ? Originally from [place], came to the [place] in 2009.  Lives at home with wife and 2 kids.  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    ? ?Immunization History  ?Administered Date(s) Administered  ? PFIZER(Purple Top)SARS-COV-2 Vaccination 09/13/2019, 10/04/2019  ?  ? ?Objective: ?Vital Signs: BP 138/88 (BP Location: Left Arm, Patient Position: Sitting,  Cuff Size: Normal)   Pulse 78   Ht _0  (1.778 m)   Wt 192 lb 9.6 oz (87.4 kg)   BMI 27.64 kg/m?   ? ?Physical Exam ?Vitals and nursing note reviewed.  ?Constitutional:   ?   Appearance: He is well-developed.  ?HENT:  ?   Head: Normocephalic and atraumatic.  ?Eyes:  ?   Conjunctiva/sclera: Conjunctivae normal.  ?   Pupils: Pupils are equal, round, and reactive to light.   ?Cardiovascular:  ?   Rate and Rhythm: Normal rate and regular rhythm.  ?   Heart sounds: Normal heart sounds.  ?Pulmonary:  ?   Effort: Pulmonary effort is normal.  ?   Breath sounds: Normal breath sounds.  ?Abdominal:  ?   General: Bowel sounds are normal.  ?   Palpations: Abdomen is soft.  ?Musculoskeletal:  ?   Cervical back: Normal range of motion and neck supple.  ?Skin: ?   General: Skin is warm and dry.  ?   Capillary Refill: Capillary refill takes less than 2 seconds.  ?Neurological:  ?   Mental Status: He is alert and oriented to person, place, and time.  ?Psychiatric:     ?   Behavior: Behavior normal.  ?  ? ?Musculoskeletal Exam: C-spine was in good range of motion.  He had discomfort range of motion of the thoracic and lumbar spine.  He had good flexibility in his spine.  He had tenderness over bilateral SI joints and coccyx region.  Shoulder joints, elbow joints, wrist joints, MCPs PIPs and DIPs with good range of motion with no synovitis.  Hip joints, knee joints, ankles, MTPs and PIPs with good range of motion.  There was no evidence of Achilles tendinitis of Planter fasciitis. ? ?CDAI Exam: ?CDAI Score: -- ?Patient Global: --; Provider Global: -- ?Swollen: --; Tender: -- ?Joint Exam 09/26/2021  ? ?No joint exam has been documented for this visit  ? ?There is currently no information documented on the homunculus. Go to the Rheumatology activity and complete the homunculus joint exam. ? ?Investigation: ?No additional findings. ? ?Imaging: ?CT CARDIAC SCORING ([facility] LOCATIONS ONLY) ? ?Result Date: 09/21/2021 ?CLINICAL DATA:  Mixed hyperlipidemia EXAM: CT CARDIAC CORONARY ARTERY CALCIUM SCORE TECHNIQUE: Non-contrast imaging through the heart was performed using prospective ECG gating. Image post processing was performed on an independent workstation, allowing for quantitative analysis of the heart and coronary arteries. Note that this exam targets the heart and the chest was not imaged in its entirety.  COMPARISON:  None. FINDINGS: CORONARY CALCIUM SCORES: Left Main: 0 LAD: 85 LCx: 22.8 RCA: 0 Total Agatston Score: 108 MESA database percentile: 94. Note, this is compared to 62 year olds, the youngest in the [place]. AORTA MEASUREMENTS: Ascending Aorta: 35 mm Descending Aorta: 25 mm OTHER FINDINGS: Heart is normal size. Aorta normal caliber. No adenopathy. No confluent opacities or effusions. No acute findings in the upper abdomen. Chest wall soft tissues are unremarkable. No acute bony abnormality. IMPRESSION: Total Agatston score: [place] percentile: 30 when compared to 35 year olds, the youngest in the [employer]. No acute or significant extracardiac abnormality. Electronically Signed   By: Rolm Baptise M.D.   On: 09/21/2021 21:04   ? ?Recent Labs: ?Lab Results  ?Component Value Date  ? HGB 15.3 04/06/2015  ? NA 140 04/06/2015  ? K 3.7 04/06/2015  ? CL 102 04/06/2015  ? CO2 27 12/04/2012  ? GLUCOSE 100 (H) 04/06/2015  ? BUN 12 04/06/2015  ?  CREATININE 0.90 09/07/2020  ? BILITOT 0.5 12/04/2012  ? ALKPHOS 87 12/04/2012  ? AST 32 12/04/2012  ? ALT 89 (H) 12/04/2012  ? PROT 7.8 09/08/2020  ? ALBUMIN 4.7 12/04/2012  ? CALCIUM 10.0 12/04/2012  ? ?September 08, 2020 SPEP normal, CK 95, RF negative, anti-CCP negative, HLA-B27 positive, B12 375 normal, folate normal ?Speciality Comments: No specialty comments available. ? ?Procedures:  ?No procedures performed ?Allergies: Patient has no known allergies.  ? ?Assessment / Plan:     ?Visit Diagnoses: Pain in thoracic spine -history of experiencing pain and discomfort in his thoracic region for the last 1 year now.  He states the pain has become more intense in the last 3 months.  X-rays obtained at the last visit showed mild scoliosis.  No syndesmophytes were noted. ? ?Chronic midline low back pain without sciatica -has been experiencing increased pain and discomfort in his lumbar spine.  He has some discomfort with mobility.  He states he is unable to sit for  prolonged time due to lower back pain.  He also gives history of stiffness throughout the day.  Plan: Sedimentation rate, C-reactive protein ? ?Chronic SI joint pain -has been experiencing pain in the SI joints.  He has

## 2021-09-26 ENCOUNTER — Encounter: Payer: Self-pay | Admitting: Rheumatology

## 2021-09-26 ENCOUNTER — Ambulatory Visit: Payer: 59 | Admitting: Rheumatology

## 2021-09-26 VITALS — BP 138/88 | HR 78 | Ht 70.0 in | Wt 192.6 lb

## 2021-09-26 DIAGNOSIS — M545 Low back pain, unspecified: Secondary | ICD-10-CM | POA: Diagnosis not present

## 2021-09-26 DIAGNOSIS — C641 Malignant neoplasm of right kidney, except renal pelvis: Secondary | ICD-10-CM

## 2021-09-26 DIAGNOSIS — M546 Pain in thoracic spine: Secondary | ICD-10-CM

## 2021-09-26 DIAGNOSIS — R202 Paresthesia of skin: Secondary | ICD-10-CM

## 2021-09-26 DIAGNOSIS — E785 Hyperlipidemia, unspecified: Secondary | ICD-10-CM

## 2021-09-26 DIAGNOSIS — G8929 Other chronic pain: Secondary | ICD-10-CM

## 2021-09-26 DIAGNOSIS — Z8269 Family history of other diseases of the musculoskeletal system and connective tissue: Secondary | ICD-10-CM

## 2021-09-26 DIAGNOSIS — Z1589 Genetic susceptibility to other disease: Secondary | ICD-10-CM

## 2021-09-26 DIAGNOSIS — M533 Sacrococcygeal disorders, not elsewhere classified: Secondary | ICD-10-CM

## 2021-09-26 DIAGNOSIS — M791 Myalgia, unspecified site: Secondary | ICD-10-CM

## 2021-09-26 DIAGNOSIS — N200 Calculus of kidney: Secondary | ICD-10-CM

## 2021-09-26 DIAGNOSIS — M2550 Pain in unspecified joint: Secondary | ICD-10-CM

## 2021-09-26 DIAGNOSIS — K648 Other hemorrhoids: Secondary | ICD-10-CM

## 2021-09-26 DIAGNOSIS — Z8719 Personal history of other diseases of the digestive system: Secondary | ICD-10-CM

## 2021-09-26 DIAGNOSIS — R5383 Other fatigue: Secondary | ICD-10-CM

## 2021-09-26 DIAGNOSIS — K580 Irritable bowel syndrome with diarrhea: Secondary | ICD-10-CM

## 2021-09-26 NOTE — Patient Instructions (Signed)

## 2021-09-27 LAB — CBC WITH DIFFERENTIAL/PLATELET
Absolute Monocytes: 336 cells/uL (ref 200–950)
Basophils Absolute: 20 cells/uL (ref 0–200)
Basophils Relative: 0.5 %
Eosinophils Absolute: 100 cells/uL (ref 15–500)
Eosinophils Relative: 2.5 %
HCT: 42.9 % (ref 38.5–50.0)
Hemoglobin: 14.3 g/dL (ref 13.2–17.1)
Lymphs Abs: 1500 cells/uL (ref 850–3900)
MCH: 27.8 pg (ref 27.0–33.0)
MCHC: 33.3 g/dL (ref 32.0–36.0)
MCV: 83.3 fL (ref 80.0–100.0)
MPV: 10.6 fL (ref 7.5–12.5)
Monocytes Relative: 8.4 %
Neutro Abs: 2044 cells/uL (ref 1500–7800)
Neutrophils Relative %: 51.1 %
Platelets: 244 10*3/uL (ref 140–400)
RBC: 5.15 10*6/uL (ref 4.20–5.80)
RDW: 13.3 % (ref 11.0–15.0)
Total Lymphocyte: 37.5 %
WBC: 4 10*3/uL (ref 3.8–10.8)

## 2021-09-27 LAB — COMPLETE METABOLIC PANEL WITH GFR
AG Ratio: 1.8 (calc) (ref 1.0–2.5)
ALT: 24 U/L (ref 9–46)
AST: 16 U/L (ref 10–40)
Albumin: 4.9 g/dL (ref 3.6–5.1)
Alkaline phosphatase (APISO): 83 U/L (ref 36–130)
BUN: 17 mg/dL (ref 7–25)
CO2: 27 mmol/L (ref 20–32)
Calcium: 10 mg/dL (ref 8.6–10.3)
Chloride: 104 mmol/L (ref 98–110)
Creat: 1.01 mg/dL (ref 0.60–1.29)
Globulin: 2.7 g/dL (calc) (ref 1.9–3.7)
Glucose, Bld: 102 mg/dL — ABNORMAL HIGH (ref 65–99)
Potassium: 4.2 mmol/L (ref 3.5–5.3)
Sodium: 140 mmol/L (ref 135–146)
Total Bilirubin: 0.5 mg/dL (ref 0.2–1.2)
Total Protein: 7.6 g/dL (ref 6.1–8.1)
eGFR: 95 mL/min/{1.73_m2} (ref >=60)

## 2021-09-27 LAB — SEDIMENTATION RATE: Sed Rate: 2 mm/h (ref 0–15)

## 2021-09-27 LAB — C-REACTIVE PROTEIN: CRP: 1.5 mg/L (ref <8.0)

## 2021-10-04 ENCOUNTER — Telehealth: Payer: Self-pay | Admitting: *Deleted

## 2021-10-04 NOTE — Telephone Encounter (Signed)
Patient advised insurance does not approve 2 MRIs at 1 time.  MRI of the thoracic spine is a separate study.  We will have to start with the MRI of the SI joints.  If the MRI of the sacrum is negative then we may be able to get MRI of thoracic spine in the future. Patient expressed understanding.  ?

## 2021-10-04 NOTE — Telephone Encounter (Signed)
Patient states he has an MRI scheduled for tomorrow of the  SACRUM SI JOINTS. Patient states he is also having pain in his upper back. Patient would like to know if this MRI will capture everything. He states it is expensive and does not want to have to have a second one. Please advise.  ?

## 2021-10-04 NOTE — Telephone Encounter (Signed)
Insurance does not approve 2 MRIs at 1 time.  MRI of the thoracic spine is a separate study.  We will have to start with the MRI of the SI joints.  If the MRI of the sacrum is negative then we may be able to get MRI of thoracic spine in the future.

## 2021-10-05 ENCOUNTER — Ambulatory Visit (HOSPITAL_COMMUNITY): Payer: 59

## 2021-10-13 ENCOUNTER — Encounter: Payer: Self-pay | Admitting: Cardiovascular Disease

## 2021-10-13 ENCOUNTER — Ambulatory Visit: Payer: 59 | Admitting: Cardiovascular Disease

## 2021-10-13 VITALS — BP 128/90 | HR 78 | Ht 70.0 in | Wt 193.4 lb

## 2021-10-13 DIAGNOSIS — E782 Mixed hyperlipidemia: Secondary | ICD-10-CM | POA: Diagnosis not present

## 2021-10-13 DIAGNOSIS — R931 Abnormal findings on diagnostic imaging of heart and coronary circulation: Secondary | ICD-10-CM

## 2021-10-13 MED ORDER — ROSUVASTATIN CALCIUM 10 MG PO TABS: 10.0000 mg | ORAL_TABLET | ORAL | 3 refills | Status: AC

## 2021-10-13 NOTE — Patient Instructions (Signed)
Medication Instructions:  ?Start Crestor 10 mg every other day  ? ?*If you need a refill on your cardiac medications before your next appointment, please call your pharmacy* ? ? ?Lab Work: ?LIPID (1 week before appointment in 3 months, come fasting- nothing to eat or drink)  ? ?If you have labs (blood work) drawn today and your tests are completely normal, you will receive your results only by: ?MyChart Message (if you have MyChart) OR ?A paper copy in the mail ?If you have any lab test that is abnormal or we need to change your treatment, we will call you to review the results. ? ? ?Follow-Up: ?At [facility], you and your health needs are our priority.  As part of our continuing mission to provide you with exceptional heart care, we have created designated Provider Care Teams.  These Care Teams include your primary Cardiologist (physician) and Advanced Practice Providers (APPs -  Physician Assistants and Nurse Practitioners) who all work together to provide you with the care you need, when you need it. ? ?We recommend signing up for the patient portal called "MyChart".  Sign up information is provided on this After Visit Summary.  MyChart is used to connect with patients for Virtual Visits (Telemedicine).  Patients are able to view lab/test results, encounter notes, upcoming appointments, etc.  Non-urgent messages can be sent to your provider as well.   ?To learn more about what you can do with MyChart, go to [email or web address].   ? ?Your next appointment:   ?3 month(s) ? ?The format for your next appointment:   ?In Person ? ?Provider:   ?Eleonore Chiquito, MD  ? ? ? ? ? ? ? ? ? ?

## 2021-10-13 NOTE — Progress Notes (Signed)
?Cardiology Office Note:   ?Date:  10/13/2021  ?NAME:  Kenneth Sandoval    ?MRN: [id number] ?DOB:  1980-10-04  ? ?PCP:  Antony Contras, MD  ?Cardiologist:  None  ?Electrophysiologist:  None  ? ?Referring MD: Antony Contras, MD  ? ?Chief Complaint  ?Patient presents with  ? New Patient (Initial Visit)  ? ? ?History of Present Illness:   ?Kenneth Sandoval is a 41 y.o. male with a hx of hyperlipidemia who is being seen today for the evaluation of hyperlipidemia/coronary calcium at the request of Antony Contras, MD. he reports having elevated cholesterol for several years.  His primary care physician obtained a coronary calcium score that was elevated.  He was referred to [place].  Apparently he has taken Crestor in the past.  He reports he is not keen on taking medications long-term.  He denies any chest pain or trouble breathing.  He reports he can walk several miles and play soccer without limitations.  No other medical problems.  He does not take medications chronically.  There is no strong family history of heart disease.  He did develop renal cancer in the past and is status post partial nephrectomy.  Seems to be doing well since that surgery.  Denies any smoking.  No alcohol or drug use is reported.  He is married with 2 children.  Blood pressure within limits.  CV exam normal.  EKG demonstrates sinus rhythm with nonspecific ST-T changes.  Overall without complaints.  Okay to go back on a statin. ? ?Problem List ?Coronary Calcium ?-Calcium score 108, 94th percentile ?-T chol 277, HDL 46, LDL 178, triglycerides 276 ? ?Past Medical History: ?Past Medical History:  ?Diagnosis Date  ? GERD (gastroesophageal reflux disease)   ? Hyperlipidemia   ? ? ?Past Surgical History: ?Past Surgical History:  ?Procedure Laterality Date  ? PARTIAL NEPHRECTOMY Right 10/03/2020  ? ? ?Current Medications: ?Current Meds  ?Medication Sig  ? rosuvastatin (CRESTOR) 10 MG tablet Take 1 tablet (10 mg total) by mouth every other day.  ?   ? ?Allergies:    ?Patient has no known allergies.  ? ?Social History: ?Social History  ? ?Socioeconomic History  ? Marital status: Married  ?  Spouse name: Not on file  ? Number of children: 2  ? Years of education: Not on file  ? Highest education level: Not on file  ?Occupational History  ? Occupation: Careers information officer  ?Tobacco Use  ? Smoking status: Never  ? Smokeless tobacco: Never  ?Vaping Use  ? Vaping Use: Never used  ?Substance and Sexual Activity  ? Alcohol use: No  ? Drug use: No  ? Sexual activity: Not on file  ?Other Topics Concern  ? Not on file  ?Social History Narrative  ? Originally from [place], came to the [place] in 2009.  Lives at home with wife and 2 kids.  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    ? ?Social Determinants of Health  ? ?Financial Resource Strain: Not on file  ?Food Insecurity: Not on file  ?Transportation Needs: Not on file  ?Physical Activity: Not on file  ?Stress: Not on file  ?Social Connections: Not on file  ?  ? ?Family History: ?The patient's family history includes Asthma in his father. ? ?ROS:   ?All other ROS reviewed and negative. Pertinent positives noted in the HPI.    ? ?EKGs/Labs/Other Studies Reviewed:   ?The following studies were personally reviewed by me today: ? ?  EKG:  EKG is ordered today.  The ekg ordered today demonstrates normal sinus rhythm heart rate 78, nonspecific ST-T changes, and was personally reviewed by me.  ? ?CACS ?CORONARY CALCIUM SCORES: ?  ?Left Main: 0 ?  ?LAD: 26 ?  ?LCx: 22.8 ?  ?RCA: 0 ?  ?Total Agatston Score: 108 ?  ?MESA database percentile: 73. Note, this is  ? ?Recent Labs: ?09/26/2021: ALT 24; BUN 17; Creat 1.01; Hemoglobin 14.3; Platelets 244; Potassium 4.2; Sodium 140  ? ?Recent Lipid Panel ?No results found for: CHOL, TRIG, HDL, CHOLHDL, VLDL, LDLCALC, LDLDIRECT ? ?Physical Exam:   ?VS:  BP 128/90   Pulse 78   Ht '5\' 10"'$  (1.778 m)   Wt 193 lb 6.4 oz (87.7 kg)   SpO2 98%   BMI 27.75 kg/m?     ?Wt Readings from Last 3 Encounters:  ?10/13/21 193 lb 6.4 oz (87.7 kg)  ?09/26/21 192 lb 9.6 oz (87.4 kg)  ?10/11/20 173 lb 3.2 oz (78.6 kg)  ?  ?General: Well nourished, well developed, in no acute distress ?Head: Atraumatic, normal size  ?Eyes: PEERLA, EOMI  ?Neck: Supple, no JVD ?Endocrine: No thryomegaly ?Cardiac: Normal S1, S2; RRR; no murmurs, rubs, or gallops ?Lungs: Clear to auscultation bilaterally, no wheezing, rhonchi or rales  ?Abd: Soft, nontender, no hepatomegaly  ?Ext: No edema, pulses 2+ ?Musculoskeletal: No deformities, BUE and BLE strength normal and equal ?Skin: Warm and dry, no rashes   ?Neuro: Alert and oriented to person, place, time, and situation, CNII-XII grossly intact, no focal deficits  ?Psych: Normal mood and affect  ? ?ASSESSMENT:   ?Kenneth Sandoval is a 41 y.o. male who presents for the following: ?1. Agatston coronary artery calcium score between 100 and 199   ?2. Mixed hyperlipidemia   ? ? ?PLAN:   ?1. Agatston coronary artery calcium score between 100 and 199 ?2. Mixed hyperlipidemia ?-Elevated coronary calcium score above the 75th percentile.  I do not recommend aspirin.  I think the best thing to do is to reduce his LDL cholesterol.  He is okay to go back on Crestor 20 mg every other day.  We will see how he does.  He is reluctant to take it daily.  He has no symptoms of angina.  His cardiovascular lamination is normal.  I see no need for stress test.  We will plan to see him back in 3 months and recheck at that time.  His EKG demonstrates nonspecific ST-T changes.  Given absence of symptoms and normal cardiovascular examination I see no need for an echocardiogram.  I will see him back in 3 months to discuss further. ? ?Disposition: Return in about 3 months (around 01/12/2022). ? ?Medication Adjustments/Labs and Tests Ordered: ?Current medicines are reviewed at length with the patient today.  Concerns regarding medicines are outlined above.  ?Orders Placed This Encounter   ?Procedures  ? Lipid panel  ? EKG 12-Lead  ? ?Meds ordered this encounter  ?Medications  ? rosuvastatin (CRESTOR) 10 MG tablet  ?  Sig: Take 1 tablet (10 mg total) by mouth every other day.  ?  Dispense:  45 tablet  ?  Refill:  3  ? ? ?Patient Instructions  ?Medication Instructions:  ?Start Crestor 10 mg every other day  ? ?*If you need a refill on your cardiac medications before your next appointment, please call your pharmacy* ? ? ?Lab Work: ?LIPID (1 week before appointment in 3 months, come fasting- nothing to eat or drink)  ? ?  If you have labs (blood work) drawn today and your tests are completely normal, you will receive your results only by: ?MyChart Message (if you have MyChart) OR ?A paper copy in the mail ?If you have any lab test that is abnormal or we need to change your treatment, we will call you to review the results. ? ? ?Follow-Up: ?At [facility], you and your health needs are our priority.  As part of our continuing mission to provide you with exceptional heart care, we have created designated Provider Care Teams.  These Care Teams include your primary Cardiologist (physician) and Advanced Practice Providers (APPs -  Physician Assistants and Nurse Practitioners) who all work together to provide you with the care you need, when you need it. ? ?We recommend signing up for the patient portal called "MyChart".  Sign up information is provided on this After Visit Summary.  MyChart is used to connect with patients for Virtual Visits (Telemedicine).  Patients are able to view lab/test results, encounter notes, upcoming appointments, etc.  Non-urgent messages can be sent to your provider as well.   ?To learn more about what you can do with MyChart, go to [email or web address].   ? ?Your next appointment:   ?3 month(s) ? ?The format for your next appointment:   ?In Person ? ?Provider:   ?Eleonore Chiquito, MD  ? ? ? ? ? ? ? ? ?  ? ?Signed, ?[place] B[place] T. Audie Box, MD, [facility] ?[address]  ?[address], [address] ?[place], [place] [address] ?[phone]  ?10/13/2021 4:19 PM    ? ?

## 2021-10-18 NOTE — Progress Notes (Deleted)
Office Visit Note  Patient: Kenneth Sandoval             Date of Birth: 01/02/1981           MRN: [id number]             PCP: Antony Contras, MD Referring: Antony Contras, MD Visit Date: 11/01/2021 Occupation: '@GUAROCC'$ @  Subjective:  No chief complaint on file.   History of Present Illness: Kenneth Sandoval is a 41 y.o. male ***   Activities of Daily Living:  Patient reports morning stiffness for *** {minute/hour:19697}.   Patient {ACTIONS;DENIES/REPORTS:21021675::"Denies"} nocturnal pain.  Difficulty dressing/grooming: {ACTIONS;DENIES/REPORTS:21021675::"Denies"} Difficulty climbing stairs: {ACTIONS;DENIES/REPORTS:21021675::"Denies"} Difficulty getting out of chair: {ACTIONS;DENIES/REPORTS:21021675::"Denies"} Difficulty using hands for taps, buttons, cutlery, and/or writing: {ACTIONS;DENIES/REPORTS:21021675::"Denies"}  No Rheumatology ROS completed.   PMFS History:  Patient Active Problem List   Diagnosis Date Noted   Irritable bowel syndrome with diarrhea 09/07/2020   Malignant neoplasm of right kidney ([place]) 09/07/2020   Internal hemorrhoids 09/07/2020   Dyslipidemia 09/07/2020   Neuropathy 09/07/2020   History of gastroesophageal reflux (GERD) 09/07/2020    Past Medical History:  Diagnosis Date   GERD (gastroesophageal reflux disease)    Hyperlipidemia     Family History  Problem Relation Age of Onset   Asthma Father    Past Surgical History:  Procedure Laterality Date   PARTIAL NEPHRECTOMY Right 10/03/2020   Social History   Social History Narrative   Originally from [place], came to the [place] in 2009.  Lives at home with wife and 2 kids.  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     Immunization History  Administered Date(s) Administered   PFIZER(Purple Top)SARS-COV-2 Vaccination 09/13/2019, 10/04/2019     Objective: Vital Signs: There were no vitals taken for this visit.   Physical Exam   Musculoskeletal  Exam: ***  CDAI Exam: CDAI Score: -- Patient Global: --; Provider Global: -- Swollen: --; Tender: -- Joint Exam 11/01/2021   No joint exam has been documented for this visit   There is currently no information documented on the homunculus. Go to the Rheumatology activity and complete the homunculus joint exam.  Investigation: No additional findings.  Imaging: CT CARDIAC SCORING ([facility] LOCATIONS ONLY)  Result Date: 09/21/2021 CLINICAL DATA:  Mixed hyperlipidemia EXAM: CT CARDIAC CORONARY ARTERY CALCIUM SCORE TECHNIQUE: Non-contrast imaging through the heart was performed using prospective ECG gating. Image post processing was performed on an independent workstation, allowing for quantitative analysis of the heart and coronary arteries. Note that this exam targets the heart and the chest was not imaged in its entirety. COMPARISON:  None. FINDINGS: CORONARY CALCIUM SCORES: Left Main: 0 LAD: 85 LCx: 22.8 RCA: 0 Total Agatston Score: 108 MESA database percentile: 94. Note, this is compared to 56 year olds, the youngest in the [place]. AORTA MEASUREMENTS: Ascending Aorta: 35 mm Descending Aorta: 25 mm OTHER FINDINGS: Heart is normal size. Aorta normal caliber. No adenopathy. No confluent opacities or effusions. No acute findings in the upper abdomen. Chest wall soft tissues are unremarkable. No acute bony abnormality. IMPRESSION: Total Agatston score: [place] percentile: 79 when compared to 44 year olds, the youngest in the [employer]. No acute or significant extracardiac abnormality. Electronically Signed   By: Rolm Baptise M.D.   On: 09/21/2021 21:04    Recent Labs: Lab Results  Component Value Date   WBC 4.0 09/26/2021   HGB 14.3 09/26/2021   PLT 244 09/26/2021   NA 140  09/26/2021   K 4.2 09/26/2021   CL 104 09/26/2021   CO2 27 09/26/2021   GLUCOSE 102 (H) 09/26/2021   BUN 17 09/26/2021   CREATININE 1.01 09/26/2021   BILITOT 0.5 09/26/2021   ALKPHOS 87 12/04/2012    AST 16 09/26/2021   ALT 24 09/26/2021   PROT 7.6 09/26/2021   ALBUMIN 4.7 12/04/2012   CALCIUM 10.0 09/26/2021    Speciality Comments: No specialty comments available.  Procedures:  No procedures performed Allergies: Patient has no known allergies.   Assessment / Plan:     Visit Diagnoses: No diagnosis found.  Orders: No orders of the defined types were placed in this encounter.  No orders of the defined types were placed in this encounter.   Face-to-face time spent with patient was *** minutes. Greater than 50% of time was spent in counseling and coordination of care.  Follow-Up Instructions: No follow-ups on file.   Earnestine Mealing, CMA  Note - This record has been created using Editor, commissioning.  Chart creation errors have been sought, but may not always  have been located. Such creation errors do not reflect on  the standard of medical care.

## 2021-11-01 ENCOUNTER — Ambulatory Visit: Payer: 59 | Admitting: Rheumatology

## 2021-11-01 DIAGNOSIS — K648 Other hemorrhoids: Secondary | ICD-10-CM

## 2021-11-01 DIAGNOSIS — Z8719 Personal history of other diseases of the digestive system: Secondary | ICD-10-CM

## 2021-11-01 DIAGNOSIS — M791 Myalgia, unspecified site: Secondary | ICD-10-CM

## 2021-11-01 DIAGNOSIS — C641 Malignant neoplasm of right kidney, except renal pelvis: Secondary | ICD-10-CM

## 2021-11-01 DIAGNOSIS — R5383 Other fatigue: Secondary | ICD-10-CM

## 2021-11-01 DIAGNOSIS — K580 Irritable bowel syndrome with diarrhea: Secondary | ICD-10-CM

## 2021-11-01 DIAGNOSIS — G8929 Other chronic pain: Secondary | ICD-10-CM

## 2021-11-01 DIAGNOSIS — N200 Calculus of kidney: Secondary | ICD-10-CM

## 2021-11-01 DIAGNOSIS — M533 Sacrococcygeal disorders, not elsewhere classified: Secondary | ICD-10-CM

## 2021-11-01 DIAGNOSIS — M546 Pain in thoracic spine: Secondary | ICD-10-CM

## 2021-11-01 DIAGNOSIS — Z8269 Family history of other diseases of the musculoskeletal system and connective tissue: Secondary | ICD-10-CM

## 2021-11-01 DIAGNOSIS — M2550 Pain in unspecified joint: Secondary | ICD-10-CM

## 2021-11-01 DIAGNOSIS — E785 Hyperlipidemia, unspecified: Secondary | ICD-10-CM

## 2021-11-01 DIAGNOSIS — Z1589 Genetic susceptibility to other disease: Secondary | ICD-10-CM

## 2021-11-03 ENCOUNTER — Ambulatory Visit: Payer: 59 | Admitting: Interventional Cardiology

## 2022-02-01 ENCOUNTER — Ambulatory Visit: Payer: 59 | Admitting: Cardiovascular Disease

## 2022-04-02 IMAGING — CT CT CARDIAC CORONARY ARTERY CALCIUM SCORE
3 series · 13 of 20 positions shown, 15 images · non-contrast
Comparison: None.

CLINICAL DATA: Mixed hyperlipidemia

EXAM:
CT CARDIAC CORONARY ARTERY CALCIUM SCORE
TECHNIQUE: Non-contrast imaging through the heart was performed using
prospective ECG gating. Image post processing was performed on an
independent workstation, allowing for quantitative analysis of the
heart and coronary arteries. Note that this exam targets the heart
and the chest was not imaged in its entirety.

[Series 2: calcium scoring 2.00 qr36 bestdiast 69% hrt calciu · axial · 0.45mm/px · z∈[+1596,+1652]mm · 3 of 70 slices shown]
[im 14/70  vessel]
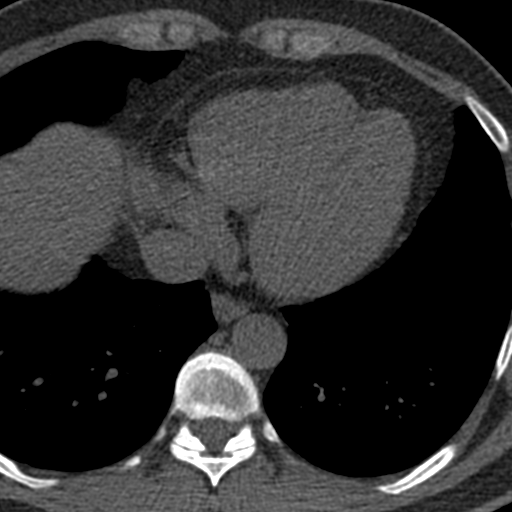
[im 28/70  vessel]
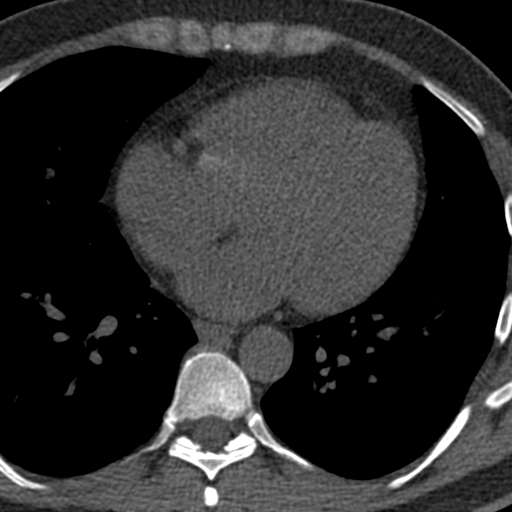
[im 42/70  vessel]
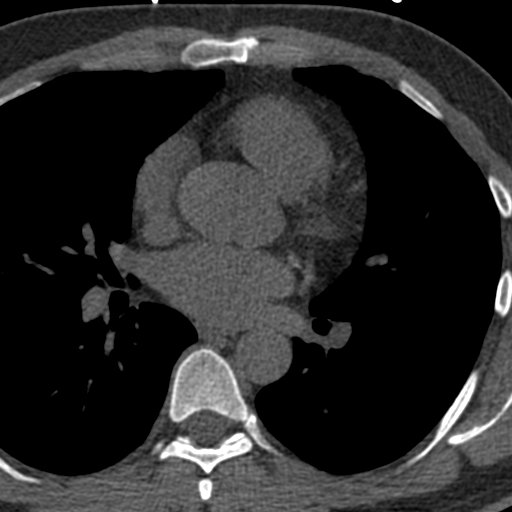

[Series 3: calcium scoring 2.00 br40 bestdiast 69% axial · axial · 0.56mm/px · z∈[+1592,+1684]mm · 5 of 70 slices shown, 7 images]
[im 12/70  vessel]
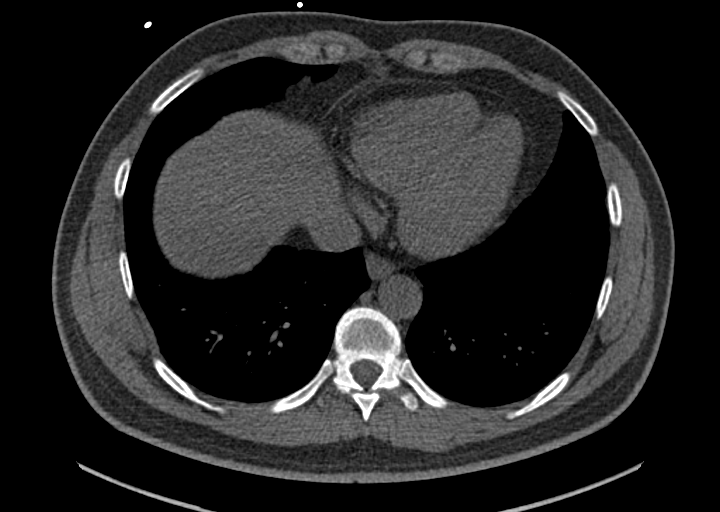
[im 12/70  lung]
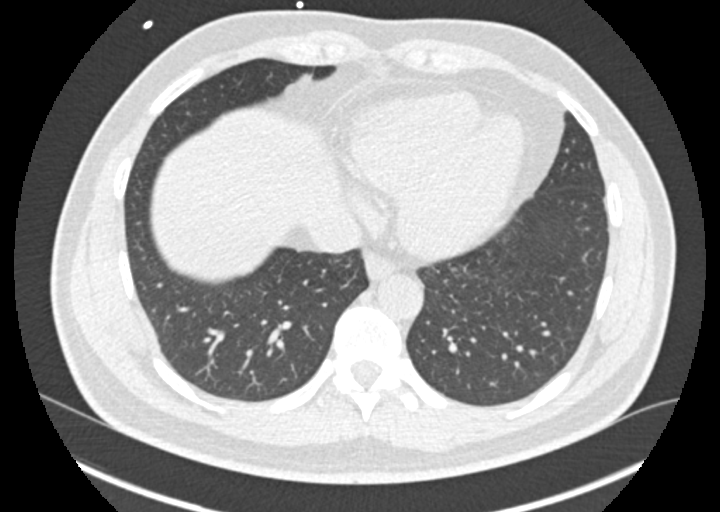
[im 24/70  vessel]
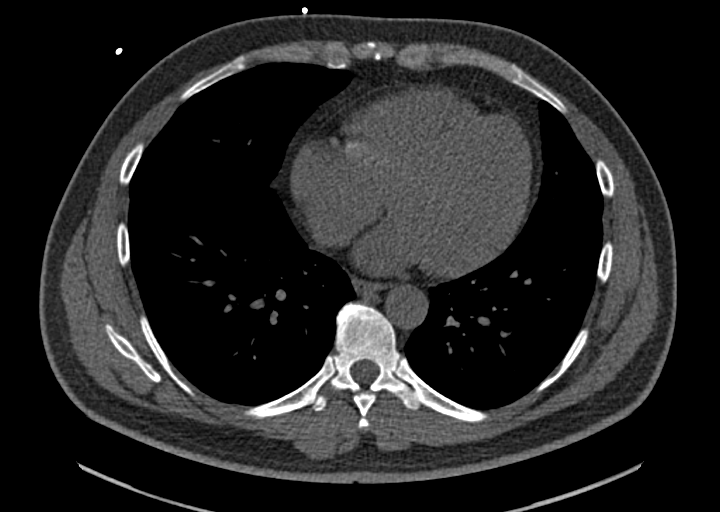
[im 35/70  vessel]
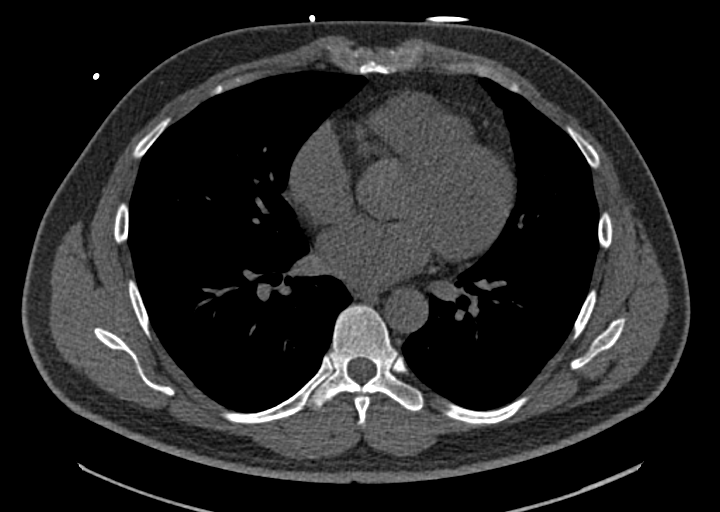
[im 47/70  vessel]
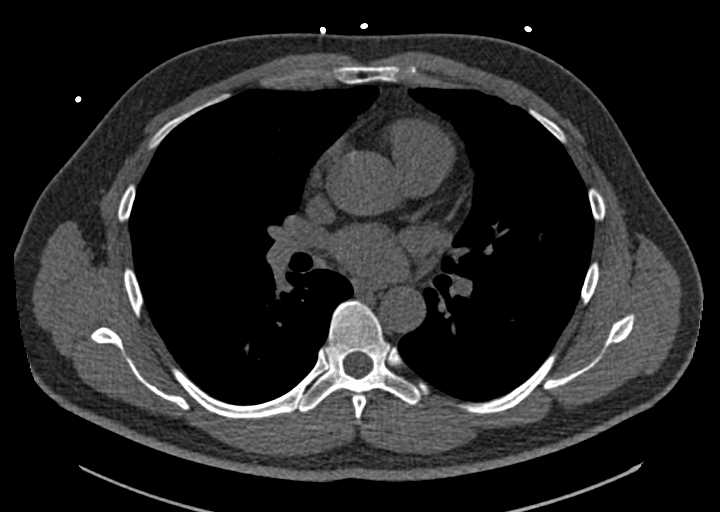
[im 58/70  vessel]
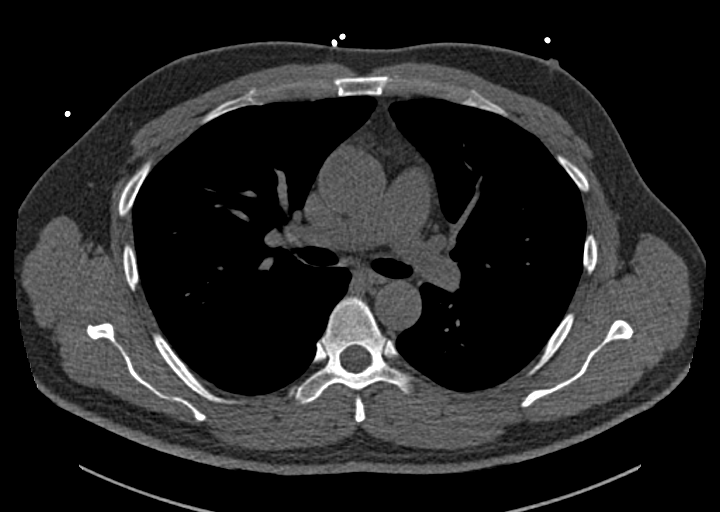
[im 58/70  lung]
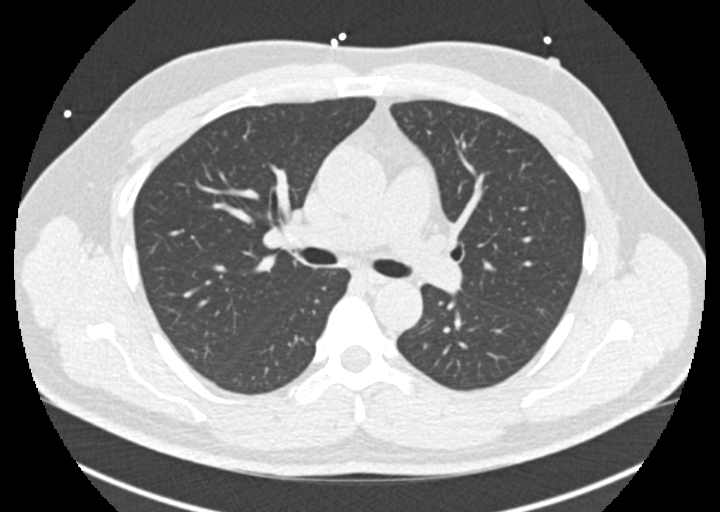

[Series 9: calcium scoring 2.00 br60 bestdiast 69% lungs · axial · 0.56mm/px · z∈[+1592,+1684]mm · 5 of 70 slices shown]
[im 12/70  vessel]
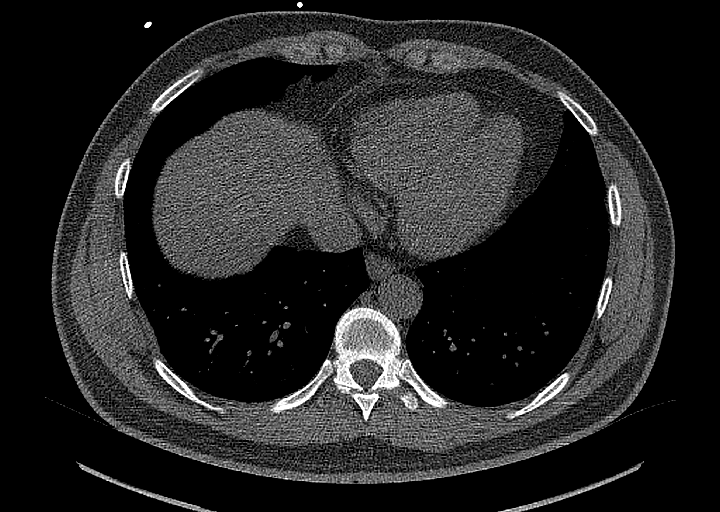
[im 24/70  vessel]
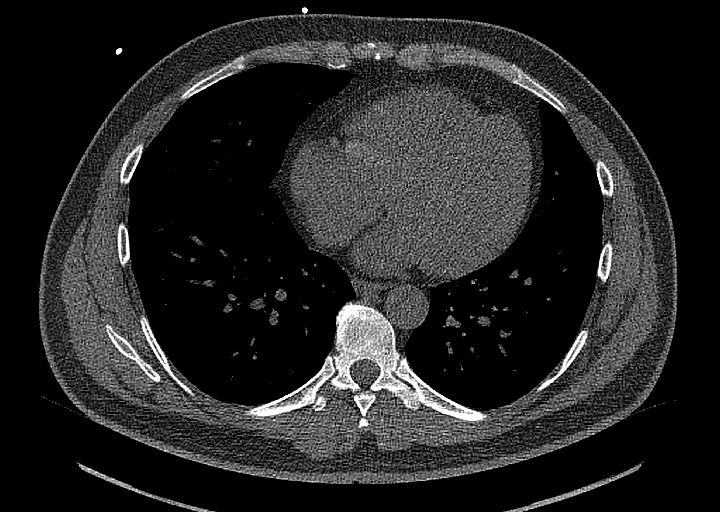
[im 35/70  vessel]
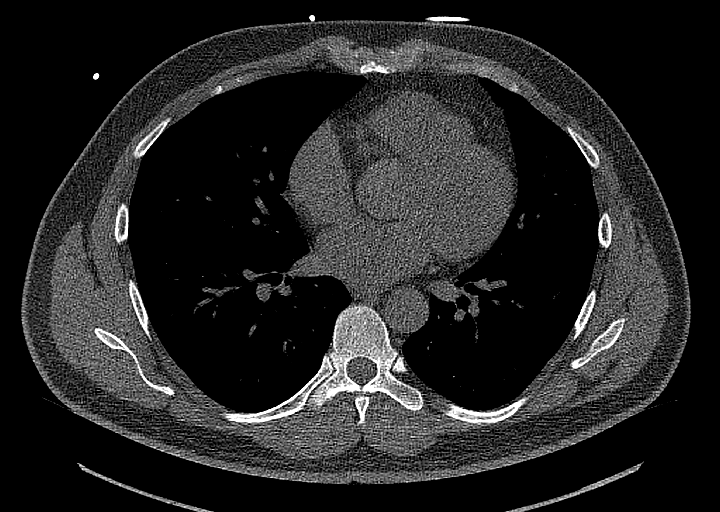
[im 47/70  vessel]
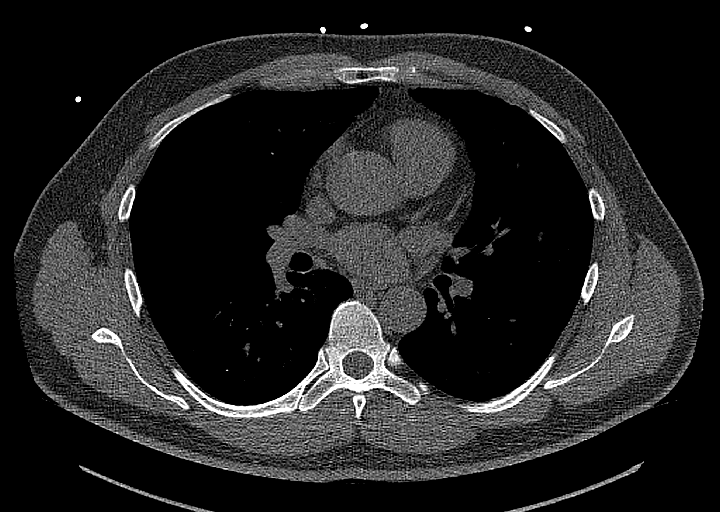
[im 58/70  vessel]
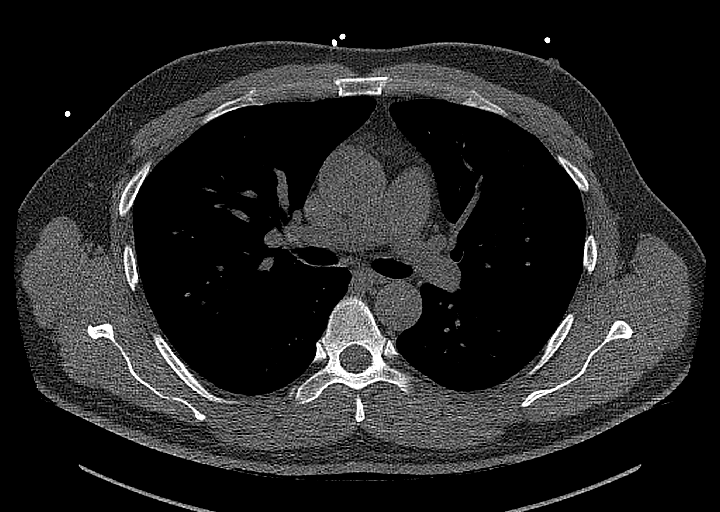

[13 of 20 positions shown; findings below may reference images not displayed]

FINDINGS: CORONARY CALCIUM SCORES:

Left Main: 0

LAD: 85

LCx:

RCA: 0

Total Agatston Score: 108

[HOSPITAL] percentile: 94. Note, this is compared to 45 year
olds, the youngest in the [HOSPITAL].

AORTA MEASUREMENTS:

Ascending Aorta: 35 mm

Descending Aorta: 25 mm

OTHER FINDINGS:

Heart is normal size. Aorta normal caliber. No adenopathy. No
confluent opacities or effusions. No acute findings in the upper
abdomen. Chest wall soft tissues are unremarkable. No acute bony
abnormality.
IMPRESSION: Total Agatston score: 108

[HOSPITAL] percentile: 94 when compared to 45 year olds, the
youngest in the [HOSPITAL].

No acute or significant extracardiac abnormality.

## 2022-10-01 DIAGNOSIS — R3989 Other symptoms and signs involving the genitourinary system: Secondary | ICD-10-CM | POA: Diagnosis not present

## 2022-11-07 DIAGNOSIS — N200 Calculus of kidney: Secondary | ICD-10-CM | POA: Diagnosis not present

## 2022-11-07 DIAGNOSIS — C641 Malignant neoplasm of right kidney, except renal pelvis: Secondary | ICD-10-CM | POA: Diagnosis not present

## 2022-11-07 DIAGNOSIS — N281 Cyst of kidney, acquired: Secondary | ICD-10-CM | POA: Diagnosis not present

## 2022-11-07 DIAGNOSIS — G894 Chronic pain syndrome: Secondary | ICD-10-CM | POA: Diagnosis not present

## 2022-11-07 DIAGNOSIS — Z905 Acquired absence of kidney: Secondary | ICD-10-CM | POA: Diagnosis not present

## 2022-11-07 DIAGNOSIS — K760 Fatty (change of) liver, not elsewhere classified: Secondary | ICD-10-CM | POA: Diagnosis not present

## 2023-02-16 DIAGNOSIS — W540XXA Bitten by dog, initial encounter: Secondary | ICD-10-CM | POA: Diagnosis not present

## 2023-02-16 DIAGNOSIS — Z23 Encounter for immunization: Secondary | ICD-10-CM | POA: Diagnosis not present

## 2023-02-16 DIAGNOSIS — S81802A Unspecified open wound, left lower leg, initial encounter: Secondary | ICD-10-CM | POA: Diagnosis not present

## 2023-02-16 DIAGNOSIS — S81852A Open bite, left lower leg, initial encounter: Secondary | ICD-10-CM | POA: Diagnosis not present

## 2023-06-25 DIAGNOSIS — E782 Mixed hyperlipidemia: Secondary | ICD-10-CM | POA: Diagnosis not present

## 2023-06-25 DIAGNOSIS — Z125 Encounter for screening for malignant neoplasm of prostate: Secondary | ICD-10-CM | POA: Diagnosis not present

## 2023-06-25 DIAGNOSIS — Z0000 Encounter for general adult medical examination without abnormal findings: Secondary | ICD-10-CM | POA: Diagnosis not present

## 2023-07-25 DIAGNOSIS — R748 Abnormal levels of other serum enzymes: Secondary | ICD-10-CM | POA: Diagnosis not present

## 2023-07-25 DIAGNOSIS — E782 Mixed hyperlipidemia: Secondary | ICD-10-CM | POA: Diagnosis not present

## 2023-11-08 DIAGNOSIS — C641 Malignant neoplasm of right kidney, except renal pelvis: Secondary | ICD-10-CM | POA: Diagnosis not present

## 2023-11-08 DIAGNOSIS — N200 Calculus of kidney: Secondary | ICD-10-CM | POA: Diagnosis not present

## 2023-11-08 DIAGNOSIS — R3989 Other symptoms and signs involving the genitourinary system: Secondary | ICD-10-CM | POA: Diagnosis not present

## 2023-12-16 DIAGNOSIS — Z1589 Genetic susceptibility to other disease: Secondary | ICD-10-CM | POA: Diagnosis not present

## 2023-12-16 DIAGNOSIS — E782 Mixed hyperlipidemia: Secondary | ICD-10-CM | POA: Diagnosis not present

## 2023-12-16 DIAGNOSIS — K219 Gastro-esophageal reflux disease without esophagitis: Secondary | ICD-10-CM | POA: Diagnosis not present

## 2023-12-16 DIAGNOSIS — Z85528 Personal history of other malignant neoplasm of kidney: Secondary | ICD-10-CM | POA: Diagnosis not present
